# Patient Record
Sex: Male | Born: 1935 | Race: White | Hispanic: No | Marital: Single | Smoking: Never smoker
Health system: Southern US, Community
[De-identification: ages and names within clinical notes are randomized; demographics above are authoritative.]

## PROBLEM LIST (undated history)

## (undated) DIAGNOSIS — M109 Gout, unspecified: Secondary | ICD-10-CM

## (undated) DIAGNOSIS — R0602 Shortness of breath: Secondary | ICD-10-CM

## (undated) DIAGNOSIS — I2510 Atherosclerotic heart disease of native coronary artery without angina pectoris: Secondary | ICD-10-CM

## (undated) DIAGNOSIS — I10 Essential (primary) hypertension: Secondary | ICD-10-CM

## (undated) DIAGNOSIS — E785 Hyperlipidemia, unspecified: Secondary | ICD-10-CM

## (undated) DIAGNOSIS — Z951 Presence of aortocoronary bypass graft: Secondary | ICD-10-CM

## (undated) DIAGNOSIS — G4730 Sleep apnea, unspecified: Secondary | ICD-10-CM

## (undated) DIAGNOSIS — E119 Type 2 diabetes mellitus without complications: Secondary | ICD-10-CM

## (undated) HISTORY — DX: Sleep apnea, unspecified: G47.30

## (undated) HISTORY — DX: Type 2 diabetes mellitus without complications: E11.9

## (undated) HISTORY — DX: Essential (primary) hypertension: I10

## (undated) HISTORY — PX: BLADDER SURGERY: SHX569

## (undated) HISTORY — DX: Hyperlipidemia, unspecified: E78.5

## (undated) HISTORY — DX: Atherosclerotic heart disease of native coronary artery without angina pectoris: I25.10

## (undated) HISTORY — PX: COLONOSCOPY: SHX174

## (undated) HISTORY — PX: APPENDECTOMY: SHX54

## (undated) HISTORY — DX: Gout, unspecified: M10.9

---

## 2006-06-27 ENCOUNTER — Emergency Department: Payer: Self-pay | Admitting: Emergency Medicine

## 2006-09-10 ENCOUNTER — Ambulatory Visit: Payer: Self-pay | Admitting: Family Medicine

## 2007-07-05 ENCOUNTER — Ambulatory Visit: Payer: Self-pay | Admitting: Gastroenterology

## 2010-04-23 ENCOUNTER — Ambulatory Visit: Payer: Self-pay | Admitting: General Surgery

## 2011-06-19 ENCOUNTER — Other Ambulatory Visit (HOSPITAL_COMMUNITY): Payer: Self-pay | Admitting: Urology

## 2011-06-19 DIAGNOSIS — R3129 Other microscopic hematuria: Secondary | ICD-10-CM

## 2011-06-19 DIAGNOSIS — R109 Unspecified abdominal pain: Secondary | ICD-10-CM

## 2011-06-24 ENCOUNTER — Ambulatory Visit (HOSPITAL_COMMUNITY)
Admission: RE | Admit: 2011-06-24 | Discharge: 2011-06-24 | Disposition: A | Discharge status: Home / Self Care | Payer: Medicare Other | Source: Ambulatory Visit | Attending: Urology | Admitting: Urology

## 2011-06-24 DIAGNOSIS — R1032 Left lower quadrant pain: Secondary | ICD-10-CM | POA: Insufficient documentation

## 2011-06-24 DIAGNOSIS — R3129 Other microscopic hematuria: Secondary | ICD-10-CM

## 2011-06-24 DIAGNOSIS — K5730 Diverticulosis of large intestine without perforation or abscess without bleeding: Secondary | ICD-10-CM | POA: Insufficient documentation

## 2011-06-24 DIAGNOSIS — R10A2 Flank pain, left side: Secondary | ICD-10-CM

## 2011-06-24 DIAGNOSIS — R109 Unspecified abdominal pain: Secondary | ICD-10-CM

## 2011-06-24 DIAGNOSIS — N400 Enlarged prostate without lower urinary tract symptoms: Secondary | ICD-10-CM | POA: Insufficient documentation

## 2011-08-18 HISTORY — PX: CARDIAC CATHETERIZATION: SHX172

## 2012-02-09 ENCOUNTER — Ambulatory Visit: Payer: Self-pay | Admitting: Internal Medicine

## 2012-02-25 DIAGNOSIS — E785 Hyperlipidemia, unspecified: Secondary | ICD-10-CM

## 2012-02-25 DIAGNOSIS — E119 Type 2 diabetes mellitus without complications: Secondary | ICD-10-CM

## 2012-02-25 DIAGNOSIS — I10 Essential (primary) hypertension: Secondary | ICD-10-CM

## 2012-02-25 DIAGNOSIS — I2510 Atherosclerotic heart disease of native coronary artery without angina pectoris: Secondary | ICD-10-CM | POA: Insufficient documentation

## 2012-02-25 DIAGNOSIS — G4730 Sleep apnea, unspecified: Secondary | ICD-10-CM

## 2012-02-25 DIAGNOSIS — M109 Gout, unspecified: Secondary | ICD-10-CM

## 2012-02-26 ENCOUNTER — Institutional Professional Consult (permissible substitution) (INDEPENDENT_AMBULATORY_CARE_PROVIDER_SITE_OTHER): Payer: Medicare Other | Admitting: Thoracic Surgery (Cardiothoracic Vascular Surgery)

## 2012-02-26 ENCOUNTER — Encounter: Payer: Self-pay | Admitting: Thoracic Surgery (Cardiothoracic Vascular Surgery)

## 2012-02-26 VITALS — BP 147/80 | HR 97 | Resp 18 | Ht 72.0 in | Wt 216.1 lb

## 2012-02-26 DIAGNOSIS — I2510 Atherosclerotic heart disease of native coronary artery without angina pectoris: Secondary | ICD-10-CM

## 2012-02-26 NOTE — Patient Instructions (Signed)
Use sublingual nitroglycerin as needed for substernal chest pain and called EMS her go directly to the emergency room for chest pain unrelieved by administration of 3 sublingual nitroglycerin tablets. Avoid strenuous physical activity.

## 2012-02-26 NOTE — Progress Notes (Signed)
[address]            [name] [address]          [phone]     CARDIOTHORACIC SURGERY CONSULTATION REPORT  Referring Provider is Lamar Blinks, MD PCP is Bobbye Riggs, NP  Chief Complaint  Patient presents with  . Coronary Artery Disease    eval and treat...cardiac cath at [facility] 02/09/12    HPI:  Patient is a 78 year old gentleman from [place] with no previous history of coronary artery disease but risk factors notable for history of hypertension, type 2 diabetes mellitus, and hyperlipidemia. The patient describes a 2 year history of progressive symptoms of exertional angina. The patient gets dull substernal chest pain with physical activity that is always relieved by rest. Symptoms are associated with nausea and shortness of breath. The patient denies any prolonged episodes of chest pain lasting more than several minutes. He has otherwise remained reasonably active physically. He recently underwent a nuclear stress test that was notable for the absence of EKG changes but perfusion images were highly suspicious for stress-induced ischemia. The patient subsequent underwent cardiac catheterization demonstrating severe three-vessel coronary artery disease with preserved left ventricular function. The patient has now been referred to consider elective surgical revascularization.  Past Medical History  Diagnosis Date  . HTN (hypertension)   . Hyperlipidemia   . Sleep apnea   . Diabetes mellitus   . CAD (coronary artery disease)   . Gout     Past Surgical History  Procedure Date  . Appendectomy     Family History  Problem Relation Age of Onset  . Cancer Father   . Stroke Mother     Social History History  Substance Use Topics  . Smoking status: Never Smoker   . Smokeless tobacco: Never Used  . Alcohol Use: No    Current Outpatient Prescriptions  Medication Sig Dispense Refill  . amLODipine (NORVASC) 5 MG tablet Take 5 mg by  mouth daily.      Marland Kitchen aspirin 81 MG tablet Take 81 mg by mouth daily.      Marland Kitchen glipiZIDE (GLUCOTROL XL) 10 MG 24 hr tablet Take 10 mg by mouth daily.      Marland Kitchen losartan (COZAAR) 100 MG tablet Take 100 mg by mouth daily.      . metFORMIN (GLUCOPHAGE) 500 MG tablet Take 500 mg by mouth 2 (two) times daily with a meal.      . zinc gluconate 50 MG tablet Take 50 mg by mouth every other day.        No Known Allergies   Review of Systems:   General:  good appetite, okay energy, lost some weight   HEENT:  no blurry vision, no recent vision changes, no epistaxis, yes hearing loss, no loose teeth or painful teeth, no dentures, last saw dentist recently  Respiratory:  yes shortness of breath, no cough, no wheezing, no hemoptysis, no pain with inspiration or cough  Cardiac:  yes chest pain with exertion dull pain in the center of the chest goes away pretty quickly, SOB with mild exertion, no resting SOB, no PND, no orthopnea, once LE edema, no palpitations, no arrhythmia, no dizzy spells, no syncope  Vascular:  no pain suggestive of claudication  GI:   no difficulty swallowing, no heartburn/reflux, no hematochezia, no hematemesis, no melena, no constipation, no diarrhea   GU:  no dysuria, no urgency, no frequency  Musculoskeletal: no arthritis, no myalgias, mobility no, some difficulty walking with pain in the chest  Neuro:   no symptoms suggestive of TIA's, no seizures, no headaches, no peripheral neuropathy, no instability of gait, no memory/cognitive dysfunction  Endocrine:  yes diabetes - reports CBGs under good control, no thyroid dysfunction  Hematologic:  no easy bruising, no anemia, no abnormal bleeding  Psych:   no anxiety, no depression     Physical Exam:   BP 147/80  Pulse 97  Resp 18  Ht 6' (1.829 m)  Wt 216 lb 0.8 oz (98 kg)  BMI 29.30 kg/m2  SpO2 95%  General:  Moderately obese,  well-appearing  HEENT:  Unremarkable   Neck:   no JVD, no bruits, no adenopathy   Chest:   clear to  auscultation, symmetrical breath sounds, no wheezes, no rhonchi   CV:   RRR,  no murmur  Abdomen:  soft, non-tender, no masses   Extremities:  warm, well-perfused, pulses not palpable, no LE edema  Rectal/GU  Deferred  Neuro:   Grossly non-focal and symmetrical throughout  Skin:   Clean and dry, no rashes, no breakdown   Diagnostic Tests:  CARDIAC CATHETERIZATION  Cardiac catheterization performed 02/09/2012 at [facility] demonstrates severe three-vessel coronary artery disease with preserved left ventricular function. Specifically, there is diffuse heavy calcification all of the epicardial coronary arteries proximally with 60% proximal left anterior descending coronary artery stenosis, 90% stenosis of the mid left anterior descending coronary artery just prior to the diagonal branch, and 90% stenosis of the distal left anterior descending coronary artery which is a large vessel that wraps the apex. The left circumflex system is relatively small. There is 60% ostial stenosis of left circumflex. There is diffuse disease with hazy 80-90% proximal right coronary artery stenosis her left ventricular systolic function is preserved with ejection fraction greater than 55%.   Impression:  Severe three-vessel coronary artery disease with preserved left ventricular function and stable symptoms of exertional angina pectoris. I agree that the patient would best be treated with surgical revascularization.   Plan:  Have outlined at length the indications, risks, and potential benefits of coronary artery bypass grafting with the patient and his wife here in the office today. Alternative treatment strategies been discussed. They understand and accept all potential associated risks of surgery including but not limited to risk of death, stroke, myocardial infarction, congestive heart failure, respiratory failure, pneumonia, bleeding requiring blood transfusion, arrhythmia, infection, and  late recurrence of symptomatic coronary artery disease. The patient desires to wait until later this months for personal reasons before proceeding with surgery. We tentatively plan for surgery on Friday, July 26. The patient will return to the office year on Monday, July 22 for followup prior to surgery. All of his questions been addressed. The patient has specifically been instructed to use sublingual nitroglycerin as needed for substernal chest pain lasting more than one or 2 minutes and to go to the emergency room or some and EMS should he develop chest pain unrelieved by administration of nitroglycerin. He is been advised to avoid strenuous physical activity.    Salvatore Decent. Cornelius Moras, MD 02/26/2012 5:16 PM

## 2012-02-29 ENCOUNTER — Encounter (HOSPITAL_COMMUNITY): Payer: Self-pay | Admitting: Pharmacy Technician

## 2012-02-29 ENCOUNTER — Other Ambulatory Visit: Payer: Self-pay | Admitting: *Deleted

## 2012-02-29 DIAGNOSIS — I2510 Atherosclerotic heart disease of native coronary artery without angina pectoris: Secondary | ICD-10-CM

## 2012-03-07 ENCOUNTER — Encounter: Payer: Self-pay | Admitting: Thoracic Surgery (Cardiothoracic Vascular Surgery)

## 2012-03-07 ENCOUNTER — Ambulatory Visit (HOSPITAL_COMMUNITY)
Admission: RE | Admit: 2012-03-07 | Discharge: 2012-03-07 | Disposition: A | Discharge status: Home / Self Care | Payer: Medicare Other | Source: Ambulatory Visit | Attending: Thoracic Surgery (Cardiothoracic Vascular Surgery) | Admitting: Thoracic Surgery (Cardiothoracic Vascular Surgery)

## 2012-03-07 ENCOUNTER — Encounter (HOSPITAL_COMMUNITY): Payer: Self-pay

## 2012-03-07 ENCOUNTER — Encounter (HOSPITAL_COMMUNITY)
Admission: RE | Admit: 2012-03-07 | Discharge: 2012-03-07 | Disposition: A | Discharge status: Home / Self Care | Payer: Medicare Other | Source: Ambulatory Visit | Attending: Thoracic Surgery (Cardiothoracic Vascular Surgery) | Admitting: Thoracic Surgery (Cardiothoracic Vascular Surgery)

## 2012-03-07 ENCOUNTER — Ambulatory Visit (INDEPENDENT_AMBULATORY_CARE_PROVIDER_SITE_OTHER): Payer: Medicare Other | Admitting: Thoracic Surgery (Cardiothoracic Vascular Surgery)

## 2012-03-07 VITALS — BP 139/78 | HR 79 | Temp 97.8°F | Resp 18 | Ht 72.0 in | Wt 214.9 lb

## 2012-03-07 VITALS — BP 151/74 | HR 78 | Resp 18 | Ht 72.0 in | Wt 215.0 lb

## 2012-03-07 DIAGNOSIS — R0602 Shortness of breath: Secondary | ICD-10-CM | POA: Insufficient documentation

## 2012-03-07 DIAGNOSIS — I2510 Atherosclerotic heart disease of native coronary artery without angina pectoris: Secondary | ICD-10-CM

## 2012-03-07 DIAGNOSIS — Z01812 Encounter for preprocedural laboratory examination: Secondary | ICD-10-CM | POA: Insufficient documentation

## 2012-03-07 DIAGNOSIS — I10 Essential (primary) hypertension: Secondary | ICD-10-CM

## 2012-03-07 DIAGNOSIS — E785 Hyperlipidemia, unspecified: Secondary | ICD-10-CM

## 2012-03-07 DIAGNOSIS — Z01818 Encounter for other preprocedural examination: Secondary | ICD-10-CM | POA: Insufficient documentation

## 2012-03-07 DIAGNOSIS — E119 Type 2 diabetes mellitus without complications: Secondary | ICD-10-CM

## 2012-03-07 DIAGNOSIS — Z01810 Encounter for preprocedural cardiovascular examination: Secondary | ICD-10-CM

## 2012-03-07 HISTORY — DX: Shortness of breath: R06.02

## 2012-03-07 LAB — COMPREHENSIVE METABOLIC PANEL
ALT: 20 U/L (ref 0–53)
AST: 20 U/L (ref 0–37)
Albumin: 3.8 g/dL (ref 3.5–5.2)
Alkaline Phosphatase: 52 U/L (ref 39–117)
Chloride: 102 mEq/L (ref 96–112)
Creatinine, Ser: 1.56 mg/dL — ABNORMAL HIGH (ref 0.50–1.35)
Potassium: 4.1 mEq/L (ref 3.5–5.1)
Sodium: 139 mEq/L (ref 135–145)
Total Bilirubin: 0.6 mg/dL (ref 0.3–1.2)

## 2012-03-07 LAB — CBC
HCT: 38.7 % — ABNORMAL LOW (ref 39.0–52.0)
MCH: 31.2 pg (ref 26.0–34.0)
MCHC: 34.1 g/dL (ref 30.0–36.0)
MCV: 91.5 fL (ref 78.0–100.0)
Platelets: 193 10*3/uL (ref 150–400)
RDW: 12.8 % (ref 11.5–15.5)
WBC: 10.2 10*3/uL (ref 4.0–10.5)

## 2012-03-07 LAB — PULMONARY FUNCTION TEST

## 2012-03-07 LAB — URINALYSIS, ROUTINE W REFLEX MICROSCOPIC
Bilirubin Urine: NEGATIVE
Glucose, UA: NEGATIVE mg/dL
Hgb urine dipstick: NEGATIVE
Ketones, ur: NEGATIVE mg/dL
Leukocytes, UA: NEGATIVE
Nitrite: NEGATIVE
Protein, ur: NEGATIVE mg/dL
Specific Gravity, Urine: 1.018 (ref 1.005–1.030)
Urobilinogen, UA: 0.2 mg/dL (ref 0.0–1.0)
pH: 5 (ref 5.0–8.0)

## 2012-03-07 LAB — BLOOD GAS, ARTERIAL
Acid-base deficit: 1.7 mmol/L (ref 0.0–2.0)
Drawn by: 206361
O2 Saturation: 95.6 %
Patient temperature: 98.6
TCO2: 23.5 mmol/L (ref 0–100)
pCO2 arterial: 36.4 mmHg (ref 35.0–45.0)

## 2012-03-07 LAB — APTT: aPTT: 26 seconds (ref 24–37)

## 2012-03-07 LAB — SURGICAL PCR SCREEN: MRSA, PCR: NEGATIVE

## 2012-03-07 MED ORDER — ALBUTEROL SULFATE (5 MG/ML) 0.5% IN NEBU
2.5000 mg | INHALATION_SOLUTION | Freq: Once | RESPIRATORY_TRACT | Status: AC
  Administered 2012-03-07: 2.5 mg via RESPIRATORY_TRACT

## 2012-03-07 NOTE — Progress Notes (Signed)
                   [address]            [name] [address]          [phone]     CARDIOTHORACIC SURGERY OFFICE NOTE  Referring Provider is Arnoldo Hooker, MD PCP is Bobbye Riggs, NP   HPI:  Patient returns for follow up with tentative plans for surgery on 03/11/2012.  He was originally seen in consultation on 02/26/2012.  He denies having any significant chest pain, and he has not complained of any other new problems.   Current Outpatient Prescriptions  Medication Sig Dispense Refill  . amLODipine (NORVASC) 5 MG tablet Take 5 mg by mouth daily.      Marland Kitchen aspirin 81 MG tablet Take 81 mg by mouth daily.      Marland Kitchen glipiZIDE (GLUCOTROL XL) 10 MG 24 hr tablet Take 10 mg by mouth daily.      Marland Kitchen losartan (COZAAR) 100 MG tablet Take 100 mg by mouth daily.      . metFORMIN (GLUCOPHAGE) 500 MG tablet Take 500-1,000 mg by mouth 3 (three) times daily. Take 2 tab in the morning, 1 at lunch, and 1 in the evening      . Multiple Vitamin (MULTIVITAMIN WITH MINERALS) TABS Take 1 tablet by mouth every other day.      . zinc gluconate 50 MG tablet Take 50 mg by mouth every other day.          Physical Exam:   BP 151/74  Pulse 78  Resp 18  Ht 6' (1.829 m)  Wt 215 lb (97.523 kg)  BMI 29.16 kg/m2  SpO2 96%  General:  Well appearing  Chest:   clear  CV:   RRR  Incisions:  n/a  Abdomen:  soft  Extremities:  warm  Diagnostic Tests:  n/a   Impression:  Severe 3-vessel CAD with stable exertional angina.   Plan:  We plan coronary artery bypass grafting on Friday, July 26. I have again reviewed the indications, risks, and potential benefits of surgery with the patient and his wife here in the office today. Also questions been addressed. He has been instructed not to take any medications on the morning of surgery other than his aspirin. He will be given a beta blocker on arrival to the hospital.    Salvatore Decent. Cornelius Moras, MD 03/07/2012 9:19 AM

## 2012-03-07 NOTE — H&P (Signed)
CARDIOTHORACIC SURGERY HISTORY AND PHYSICAL EXAM  Referring Provider is Lamar Blinks, MD PCP is Bobbye Riggs, NP    Chief Complaint   Patient presents with   .  Coronary Artery Disease       eval and treat...cardiac cath at [facility] 02/09/12     HPI:  Patient is a 78 year old gentleman from [place] with no previous history of coronary artery disease but risk factors notable for history of hypertension, type 2 diabetes mellitus, and hyperlipidemia. The patient describes a 2 year history of progressive symptoms of exertional angina. The patient gets dull substernal chest pain with physical activity that is always relieved by rest. Symptoms are associated with nausea and shortness of breath. The patient denies any prolonged episodes of chest pain lasting more than several minutes. He has otherwise remained reasonably active physically. He recently underwent a nuclear stress test that was notable for the absence of EKG changes but perfusion images were highly suspicious for stress-induced ischemia. The patient subsequent underwent cardiac catheterization demonstrating severe three-vessel coronary artery disease with preserved left ventricular function. The patient has now been referred to consider elective surgical revascularization.    Past Medical History   Diagnosis  Date   .  HTN (hypertension)     .  Hyperlipidemia     .  Sleep apnea     .  Diabetes mellitus     .  CAD (coronary artery disease)     .  Gout         Past Surgical History   Procedure  Date   .  Appendectomy         Family History   Problem  Relation  Age of Onset   .  Cancer  Father     .  Stroke  Mother       Social History History   Substance Use Topics   .  Smoking status:  Never Smoker    .  Smokeless tobacco:  Never Used   .  Alcohol Use:  No       Current Outpatient Prescriptions   Medication  Sig  Dispense  Refill   .  amLODipine (NORVASC) 5 MG tablet  Take 5 mg by mouth daily.          Marland Kitchen  aspirin 81 MG tablet  Take 81 mg by mouth daily.         Marland Kitchen  glipiZIDE (GLUCOTROL XL) 10 MG 24 hr tablet  Take 10 mg by mouth daily.         Marland Kitchen  losartan (COZAAR) 100 MG tablet  Take 100 mg by mouth daily.         .  metFORMIN (GLUCOPHAGE) 500 MG tablet  Take 500 mg by mouth 2 (two) times daily with a meal.         .  zinc gluconate 50 MG tablet  Take 50 mg by mouth every other day.           No Known Allergies   Review of Systems:              General:                      good appetite, okay energy, lost some weight               HEENT:  no blurry vision, no recent vision changes, no epistaxis, yes hearing loss, no loose teeth or painful teeth, no dentures, last saw dentist recently             Respiratory:                yes shortness of breath, no cough, no wheezing, no hemoptysis, no pain with inspiration or cough             Cardiac:                      yes chest pain with exertion dull pain in the center of the chest goes away pretty quickly, SOB with mild exertion, no resting SOB, no PND, no orthopnea, once LE edema, no palpitations, no arrhythmia, no dizzy spells, no syncope             Vascular:                     no pain suggestive of claudication             GI:                                no difficulty swallowing, no heartburn/reflux, no hematochezia, no hematemesis, no melena, no constipation, no diarrhea               GU:                              no dysuria, no urgency, no frequency             Musculoskeletal:         no arthritis, no myalgias, mobility no, some difficulty walking with pain in the chest             Neuro:                         no symptoms suggestive of TIA's, no seizures, no headaches, no peripheral neuropathy, no instability of gait, no memory/cognitive dysfunction             Endocrine:                   yes diabetes - reports CBGs under good control, no thyroid dysfunction             Hematologic:               no easy  bruising, no anemia, no abnormal bleeding             Psych:                         no anxiety, no depression                           Physical Exam:              BP 147/80  Pulse 97  Resp 18  Ht 6' (1.829 m)  Wt 216 lb 0.8 oz (98 kg)  BMI 29.30 kg/m2  SpO2 95%             General:  Moderately obese,  well-appearing             HEENT:                       Unremarkable               Neck:                           no JVD, no bruits, no adenopathy               Chest:                         clear to auscultation, symmetrical breath sounds, no wheezes, no rhonchi               CV:                              RRR,  no murmur             Abdomen:                    soft, non-tender, no masses               Extremities:                 warm, well-perfused, pulses not palpable, no LE edema             Rectal/GU                   Deferred             Neuro:                         Grossly non-focal and symmetrical throughout             Skin:                            Clean and dry, no rashes, no breakdown   Diagnostic Tests:  CARDIAC CATHETERIZATION  Cardiac catheterization performed 02/09/2012 at [facility] demonstrates severe three-vessel coronary artery disease with preserved left ventricular function. Specifically, there is diffuse heavy calcification all of the epicardial coronary arteries proximally with 60% proximal left anterior descending coronary artery stenosis, 90% stenosis of the mid left anterior descending coronary artery just prior to the diagonal branch, and 90% stenosis of the distal left anterior descending coronary artery which is a large vessel that wraps the apex. The left circumflex system is relatively small. There is 60% ostial stenosis of left circumflex. There is diffuse disease with hazy 80-90% proximal right coronary artery stenosis her left ventricular systolic function is preserved with ejection fraction greater than  55%.   Impression:  Severe three-vessel coronary artery disease with preserved left ventricular function and stable symptoms of exertional angina pectoris. I agree that the patient would best be treated with surgical revascularization.   Plan:  I have outlined at length the indications, risks, and potential benefits of coronary artery bypass grafting with the patient and his wife. Alternative treatment strategies been discussed. They understand and accept all potential associated risks of surgery including but not limited to risk of death, stroke, myocardial infarction, congestive heart failure, respiratory failure, pneumonia, bleeding requiring  blood transfusion, arrhythmia, infection, and late recurrence of symptomatic coronary artery disease. All of his questions been addressed.    Morghan Kester H

## 2012-03-07 NOTE — Progress Notes (Signed)
VASCULAR LAB PRELIMINARY  PRELIMINARY  PRELIMINARY  PRELIMINARY  Pre-op Cardiac Surgery  Carotid Findings:  No evidence of significant carotid artery stenosis. Vertebral artery flow is antegrade bilaterally  Upper Extremity Right Left  Brachial Pressures 138 Triphasic 142 Triphasic  Radial Waveforms Triphasic Triphasic  Ulnar Waveforms Triphasic Triphasic  Palmar Arch (Allen's Test) Normal Abnormal   Findings:  Doppler waveforms remained normal with both radial and ulnar compressions on the right. Left Doppler waveforms remained normal with radial compression and diminished 50% with ulnar compression.    Lower  Extremity Right Left  Dorsalis Pedis 123 Monophasic 148 Biphasic      Posterior Tibial 141 Biphasic 150 Biphasic  Ankle/Brachial Indices 1.01 1.07    Findings:  ABIs are within normal limits bilaterally at rest   Areyanna Figeroa, RVS 03/07/2012, 1:01 PM

## 2012-03-07 NOTE — Patient Instructions (Signed)
Do not take any of your usual medications on the morning of surgery other than aspirin.

## 2012-03-07 NOTE — Pre-Procedure Instructions (Signed)
20 [name] [name]  03/07/2012   Your procedure is scheduled on:  Friday March 11, 2012.  Report to [facility] at 0530 AM.  Call this number if you have problems the morning of surgery: [phone]   Remember:   Do not eat food:After Midnight.    Take these medicines the morning of surgery with A SIP OF WATER: Amlodipine (Norvasc)   Please stop any Aspirin, Plavix, or herbal medications   Do not wear jewelry, make-up or nail polish  Do not wear lotions or colognes.  Men may shave face and neck.  Do not bring valuables to the hospital.  Contacts, dentures or bridgework may not be worn into surgery.  Leave suitcase in the car. After surgery it may be brought to your room.  For patients admitted to the hospital, checkout time is 11:00 AM the day of discharge.   Patients discharged the day of surgery will not be allowed to drive home.  Name and phone number of your driver:   Special Instructions: CHG Shower Use Special Wash: 1/2 bottle night before surgery and 1/2 bottle morning of surgery.   Please read over the following fact sheets that you were given: Pain Booklet, Coughing and Deep Breathing, Open Heart Packet, MRSA Information and Surgical Site Infection Prevention

## 2012-03-08 LAB — HEMOGLOBIN A1C: Mean Plasma Glucose: 171 mg/dL — ABNORMAL HIGH (ref <117)

## 2012-03-08 NOTE — Consult Note (Addendum)
Anesthesia Chart Review:  Patient is a 78 year old male scheduled for CABG on 03/11/12 by Dr. Cornelius Moras.  History includes CAD, HTN, HLD, DM2, OSA, gout, SOB.  His Cardiologist is Dr. Arnoldo Hooker.  PCP Bobbye Riggs, NP.  EKG on 03/07/12 showed NSR with PACs, PVCs.  He had an abnormal stress test at [facility] on 01/18/12 and subsequently had a cardiac catheterization at [facility] on 02/09/12 that demonstrated severe three-vessel coronary artery disease with preserved left ventricular function. Specifically, there is diffuse heavy calcification all of the epicardial coronary arteries proximally with 60% proximal left anterior descending coronary artery stenosis, 90% stenosis of the mid left anterior descending coronary artery just prior to the diagonal branch, and 90% stenosis of the distal left anterior descending coronary artery which is a large vessel that wraps the apex. The left circumflex system is relatively small. There is 60% ostial stenosis of left circumflex. There is diffuse disease with hazy 80-90% proximal right coronary artery stenosis her left ventricular systolic function is preserved with ejection fraction greater than 55%.   CXR on 03/07/12 showed no active cardiopulmonary disease.  PFTs from 03/07/12 showed FEV1 2.13 (75% predicted).  See full report.  There was no significant ICA stenosis by duplex on 03/07/12.  Labs noted.  Cr 1.56.  Glucose 166.  There are no comparison labs currently available.  Epic records indicate labs have already been reviewed by Dr. Cornelius Moras.  Shonna Chock, PA-C

## 2012-03-10 MED ORDER — VANCOMYCIN HCL 1000 MG IV SOLR
1500.0000 mg | INTRAVENOUS | Status: AC
  Administered 2012-03-11: 1500 mg via INTRAVENOUS
  Filled 2012-03-10 (×3): qty 1500

## 2012-03-10 MED ORDER — TRANEXAMIC ACID 100 MG/ML IV SOLN
1.5000 mg/kg/h | INTRAVENOUS | Status: DC
  Filled 2012-03-10 (×2): qty 25

## 2012-03-10 MED ORDER — DOPAMINE-DEXTROSE 3.2-5 MG/ML-% IV SOLN
2.0000 ug/kg/min | INTRAVENOUS | Status: DC
  Filled 2012-03-10 (×2): qty 250

## 2012-03-10 MED ORDER — SODIUM BICARBONATE 8.4 % IV SOLN
INTRAVENOUS | Status: AC
  Administered 2012-03-11: 10:00:00
  Filled 2012-03-10: qty 2.5

## 2012-03-10 MED ORDER — DEXMEDETOMIDINE HCL IN NACL 400 MCG/100ML IV SOLN
0.1000 ug/kg/h | INTRAVENOUS | Status: DC
  Filled 2012-03-10 (×2): qty 100

## 2012-03-10 MED ORDER — NITROGLYCERIN IN D5W 200-5 MCG/ML-% IV SOLN
2.0000 ug/min | INTRAVENOUS | Status: DC
  Filled 2012-03-10: qty 250

## 2012-03-10 MED ORDER — MAGNESIUM SULFATE 50 % IJ SOLN
40.0000 meq | INTRAMUSCULAR | Status: DC
  Filled 2012-03-10: qty 10

## 2012-03-10 MED ORDER — EPINEPHRINE HCL 1 MG/ML IJ SOLN
0.5000 ug/min | INTRAVENOUS | Status: DC
  Filled 2012-03-10: qty 4

## 2012-03-10 MED ORDER — METOPROLOL TARTRATE 12.5 MG HALF TABLET
12.5000 mg | ORAL_TABLET | Freq: Once | ORAL | Status: AC
  Administered 2012-03-11: 12.5 mg via ORAL
  Filled 2012-03-10: qty 1

## 2012-03-10 MED ORDER — DEXTROSE 5 % IV SOLN
30.0000 ug/min | INTRAVENOUS | Status: DC
  Filled 2012-03-10: qty 2

## 2012-03-10 MED ORDER — TRANEXAMIC ACID (OHS) BOLUS VIA INFUSION
15.0000 mg/kg | INTRAVENOUS | Status: DC
  Filled 2012-03-10: qty 1463

## 2012-03-10 MED ORDER — SODIUM CHLORIDE 0.9 % IV SOLN
INTRAVENOUS | Status: DC
  Filled 2012-03-10: qty 1

## 2012-03-10 MED ORDER — TRANEXAMIC ACID (OHS) PUMP PRIME SOLUTION
2.0000 mg/kg | INTRAVENOUS | Status: DC
  Filled 2012-03-10: qty 1.95

## 2012-03-10 MED ORDER — POTASSIUM CHLORIDE 2 MEQ/ML IV SOLN
80.0000 meq | INTRAVENOUS | Status: DC
  Filled 2012-03-10: qty 40

## 2012-03-10 MED ORDER — DEXTROSE 5 % IV SOLN
1.5000 g | INTRAVENOUS | Status: AC
  Administered 2012-03-11: 1.5 g via INTRAVENOUS
  Administered 2012-03-11: .75 g via INTRAVENOUS
  Filled 2012-03-10 (×2): qty 1.5

## 2012-03-10 MED ORDER — CEFUROXIME SODIUM 750 MG IJ SOLR
750.0000 mg | INTRAMUSCULAR | Status: DC
  Filled 2012-03-10: qty 750

## 2012-03-10 NOTE — Progress Notes (Signed)
Call to [facility]- no sleep study available.  Call to pt., spoke /w spouse, she reports sleep study was done 3-4 yrs. Ago, at sleep center on [address] States pt. doesn't use CPAP, he gave the machine away. Call to S. Mand, NP- phone no. not ringing, line is dead when no. Is dialed multiple times. Call to [facility], spoke with [facility], she will get in touch /w pt. to sign a release.  She reports a release must be signed before she can fax me report. She is aware that surgery is 03/11/2012.

## 2012-03-10 NOTE — Progress Notes (Signed)
Call back fr. Molly at [facility], states pt. Is scheduled to appear in the [facility]  office today to sign a release & when he does, they will both fax the sleep study to [place] & give him a copy to carry to [facility] for tomorrow's surgery.

## 2012-03-11 ENCOUNTER — Encounter (HOSPITAL_COMMUNITY): Payer: Self-pay | Admitting: Thoracic Surgery (Cardiothoracic Vascular Surgery)

## 2012-03-11 ENCOUNTER — Inpatient Hospital Stay (HOSPITAL_COMMUNITY): Payer: Medicare Other

## 2012-03-11 ENCOUNTER — Inpatient Hospital Stay (HOSPITAL_COMMUNITY)
Admission: RE | Admit: 2012-03-11 | Discharge: 2012-03-16 | DRG: 236 | Disposition: A | Discharge status: Home / Self Care | Payer: Medicare Other | Source: Ambulatory Visit | Attending: Thoracic Surgery (Cardiothoracic Vascular Surgery) | Admitting: Thoracic Surgery (Cardiothoracic Vascular Surgery)

## 2012-03-11 ENCOUNTER — Encounter (HOSPITAL_COMMUNITY): Payer: Self-pay | Admitting: Vascular Surgery

## 2012-03-11 ENCOUNTER — Encounter (HOSPITAL_COMMUNITY)
Admission: RE | Disposition: A | Discharge status: Home / Self Care | Payer: Self-pay | Source: Ambulatory Visit | Attending: Thoracic Surgery (Cardiothoracic Vascular Surgery)

## 2012-03-11 ENCOUNTER — Inpatient Hospital Stay (HOSPITAL_COMMUNITY): Payer: Medicare Other | Admitting: Vascular Surgery

## 2012-03-11 ENCOUNTER — Encounter (HOSPITAL_COMMUNITY): Payer: Self-pay | Admitting: *Deleted

## 2012-03-11 DIAGNOSIS — E876 Hypokalemia: Secondary | ICD-10-CM | POA: Diagnosis not present

## 2012-03-11 DIAGNOSIS — J9819 Other pulmonary collapse: Secondary | ICD-10-CM | POA: Diagnosis not present

## 2012-03-11 DIAGNOSIS — J988 Other specified respiratory disorders: Secondary | ICD-10-CM | POA: Diagnosis not present

## 2012-03-11 DIAGNOSIS — N189 Chronic kidney disease, unspecified: Secondary | ICD-10-CM | POA: Diagnosis present

## 2012-03-11 DIAGNOSIS — I2510 Atherosclerotic heart disease of native coronary artery without angina pectoris: Principal | ICD-10-CM

## 2012-03-11 DIAGNOSIS — Y832 Surgical operation with anastomosis, bypass or graft as the cause of abnormal reaction of the patient, or of later complication, without mention of misadventure at the time of the procedure: Secondary | ICD-10-CM | POA: Diagnosis not present

## 2012-03-11 DIAGNOSIS — N289 Disorder of kidney and ureter, unspecified: Secondary | ICD-10-CM | POA: Diagnosis not present

## 2012-03-11 DIAGNOSIS — I519 Heart disease, unspecified: Secondary | ICD-10-CM | POA: Diagnosis not present

## 2012-03-11 DIAGNOSIS — I4891 Unspecified atrial fibrillation: Secondary | ICD-10-CM | POA: Diagnosis not present

## 2012-03-11 DIAGNOSIS — E785 Hyperlipidemia, unspecified: Secondary | ICD-10-CM | POA: Diagnosis present

## 2012-03-11 DIAGNOSIS — E119 Type 2 diabetes mellitus without complications: Secondary | ICD-10-CM | POA: Diagnosis present

## 2012-03-11 DIAGNOSIS — D62 Acute posthemorrhagic anemia: Secondary | ICD-10-CM | POA: Diagnosis not present

## 2012-03-11 DIAGNOSIS — Z951 Presence of aortocoronary bypass graft: Secondary | ICD-10-CM

## 2012-03-11 DIAGNOSIS — I129 Hypertensive chronic kidney disease with stage 1 through stage 4 chronic kidney disease, or unspecified chronic kidney disease: Secondary | ICD-10-CM | POA: Diagnosis present

## 2012-03-11 DIAGNOSIS — M109 Gout, unspecified: Secondary | ICD-10-CM | POA: Diagnosis present

## 2012-03-11 DIAGNOSIS — G4733 Obstructive sleep apnea (adult) (pediatric): Secondary | ICD-10-CM | POA: Diagnosis present

## 2012-03-11 DIAGNOSIS — Z79899 Other long term (current) drug therapy: Secondary | ICD-10-CM

## 2012-03-11 DIAGNOSIS — Y9210 Unspecified residential institution as the place of occurrence of the external cause: Secondary | ICD-10-CM | POA: Diagnosis not present

## 2012-03-11 DIAGNOSIS — Z7982 Long term (current) use of aspirin: Secondary | ICD-10-CM

## 2012-03-11 DIAGNOSIS — E8779 Other fluid overload: Secondary | ICD-10-CM | POA: Diagnosis not present

## 2012-03-11 HISTORY — PX: CORONARY ARTERY BYPASS GRAFT: SHX141

## 2012-03-11 HISTORY — DX: Presence of aortocoronary bypass graft: Z95.1

## 2012-03-11 LAB — CBC
HCT: 24.8 % — ABNORMAL LOW (ref 39.0–52.0)
MCH: 30.8 pg (ref 26.0–34.0)
MCH: 30.7 pg (ref 26.0–34.0)
MCV: 90.8 fL (ref 78.0–100.0)
Platelets: 135 10*3/uL — ABNORMAL LOW (ref 150–400)
Platelets: 159 10*3/uL (ref 150–400)
RBC: 3.03 MIL/uL — ABNORMAL LOW (ref 4.22–5.81)
RDW: 12.9 % (ref 11.5–15.5)
RDW: 14.1 % (ref 11.5–15.5)
WBC: 15.5 10*3/uL — ABNORMAL HIGH (ref 4.0–10.5)
WBC: 15 10*3/uL — ABNORMAL HIGH (ref 4.0–10.5)

## 2012-03-11 LAB — GLUCOSE, CAPILLARY
Glucose-Capillary: 156 mg/dL — ABNORMAL HIGH (ref 70–99)
Glucose-Capillary: 115 mg/dL — ABNORMAL HIGH (ref 70–99)
Glucose-Capillary: 116 mg/dL — ABNORMAL HIGH (ref 70–99)
Glucose-Capillary: 97 mg/dL (ref 70–99)
Glucose-Capillary: 129 mg/dL — ABNORMAL HIGH (ref 70–99)
Glucose-Capillary: 150 mg/dL — ABNORMAL HIGH (ref 70–99)
Glucose-Capillary: 120 mg/dL — ABNORMAL HIGH (ref 70–99)
Glucose-Capillary: 92 mg/dL (ref 70–99)

## 2012-03-11 LAB — POCT I-STAT 3, ART BLOOD GAS (G3+)
Acid-base deficit: 5 mmol/L — ABNORMAL HIGH (ref 0.0–2.0)
Acid-base deficit: 6 mmol/L — ABNORMAL HIGH (ref 0.0–2.0)
Bicarbonate: 21.6 mEq/L (ref 20.0–24.0)
Bicarbonate: 20.5 mEq/L (ref 20.0–24.0)
O2 Saturation: 100 %
O2 Saturation: 100 %
O2 Saturation: 93 %
O2 Saturation: 95 %
TCO2: 22 mmol/L (ref 0–100)
pCO2 arterial: 38.3 mmHg (ref 35.0–45.0)
pCO2 arterial: 32.1 mmHg — ABNORMAL LOW (ref 35.0–45.0)
pCO2 arterial: 36.8 mmHg (ref 35.0–45.0)
pH, Arterial: 7.374 (ref 7.350–7.450)
pO2, Arterial: 297 mmHg — ABNORMAL HIGH (ref 80.0–100.0)
pO2, Arterial: 230 mmHg — ABNORMAL HIGH (ref 80.0–100.0)
pO2, Arterial: 77 mmHg — ABNORMAL LOW (ref 80.0–100.0)

## 2012-03-11 LAB — POCT I-STAT 4, (NA,K, GLUC, HGB,HCT)
Glucose, Bld: 156 mg/dL — ABNORMAL HIGH (ref 70–99)
Glucose, Bld: 139 mg/dL — ABNORMAL HIGH (ref 70–99)
Glucose, Bld: 193 mg/dL — ABNORMAL HIGH (ref 70–99)
HCT: 23 % — ABNORMAL LOW (ref 39.0–52.0)
Hemoglobin: 9.5 g/dL — ABNORMAL LOW (ref 13.0–17.0)
Hemoglobin: 7.8 g/dL — ABNORMAL LOW (ref 13.0–17.0)
Potassium: 3.3 mEq/L — ABNORMAL LOW (ref 3.5–5.1)
Potassium: 4 mEq/L (ref 3.5–5.1)
Potassium: 4 mEq/L (ref 3.5–5.1)
Sodium: 144 mEq/L (ref 135–145)
Sodium: 141 mEq/L (ref 135–145)

## 2012-03-11 LAB — MAGNESIUM: Magnesium: 2.3 mg/dL (ref 1.5–2.5)

## 2012-03-11 LAB — POCT I-STAT, CHEM 8
BUN: 12 mg/dL (ref 6–23)
Calcium, Ion: 1.16 mmol/L (ref 1.13–1.30)
Chloride: 110 mEq/L (ref 96–112)
Creatinine, Ser: 1.3 mg/dL (ref 0.50–1.35)
Glucose, Bld: 144 mg/dL — ABNORMAL HIGH (ref 70–99)
TCO2: 20 mmol/L (ref 0–100)

## 2012-03-11 LAB — APTT: aPTT: 33 seconds (ref 24–37)

## 2012-03-11 LAB — CREATININE, SERUM: Creatinine, Ser: 1.2 mg/dL (ref 0.50–1.35)

## 2012-03-11 LAB — PREPARE RBC (CROSSMATCH)

## 2012-03-11 LAB — PLATELET COUNT: Platelets: 179 10*3/uL (ref 150–400)

## 2012-03-11 LAB — HEMOGLOBIN AND HEMATOCRIT, BLOOD: HCT: 23.2 % — ABNORMAL LOW (ref 39.0–52.0)

## 2012-03-11 SURGERY — CORONARY ARTERY BYPASS GRAFTING (CABG)
Anesthesia: General | Site: Chest | Wound class: Clean

## 2012-03-11 MED ORDER — CALCIUM CHLORIDE 10 % IV SOLN
1.0000 g | Freq: Once | INTRAVENOUS | Status: AC | PRN
  Filled 2012-03-11: qty 10

## 2012-03-11 MED ORDER — PANTOPRAZOLE SODIUM 40 MG PO TBEC
40.0000 mg | DELAYED_RELEASE_TABLET | Freq: Every day | ORAL | Status: DC
  Administered 2012-03-13 – 2012-03-15 (×3): 40 mg via ORAL
  Filled 2012-03-11 (×3): qty 1

## 2012-03-11 MED ORDER — PROTAMINE SULFATE 10 MG/ML IV SOLN
INTRAVENOUS | Status: DC | PRN
  Administered 2012-03-11 (×2): 50 mg via INTRAVENOUS
  Administered 2012-03-11: 150 mg via INTRAVENOUS
  Administered 2012-03-11: 50 mg via INTRAVENOUS

## 2012-03-11 MED ORDER — SODIUM CHLORIDE 0.45 % IV SOLN
INTRAVENOUS | Status: DC
  Administered 2012-03-11: 13:00:00 via INTRAVENOUS

## 2012-03-11 MED ORDER — HEPARIN SODIUM (PORCINE) 1000 UNIT/ML IJ SOLN
INTRAMUSCULAR | Status: AC
  Filled 2012-03-11: qty 1

## 2012-03-11 MED ORDER — OXYCODONE HCL 5 MG PO TABS
5.0000 mg | ORAL_TABLET | ORAL | Status: DC | PRN
  Administered 2012-03-12: 5 mg via ORAL
  Filled 2012-03-11: qty 1

## 2012-03-11 MED ORDER — SODIUM CHLORIDE 0.9 % IV SOLN: 250.0000 mL | INTRAVENOUS | Status: DC

## 2012-03-11 MED ORDER — ALBUMIN HUMAN 5 % IV SOLN
INTRAVENOUS | Status: AC
  Filled 2012-03-11: qty 250

## 2012-03-11 MED ORDER — ALBUMIN HUMAN 5 % IV SOLN
250.0000 mL | INTRAVENOUS | Status: AC | PRN
  Administered 2012-03-11 (×2): 250 mL via INTRAVENOUS

## 2012-03-11 MED ORDER — SODIUM CHLORIDE 0.9 % IJ SOLN
3.0000 mL | Freq: Two times a day (BID) | INTRAMUSCULAR | Status: DC
  Administered 2012-03-12 – 2012-03-13 (×4): 3 mL via INTRAVENOUS

## 2012-03-11 MED ORDER — SODIUM CHLORIDE 0.9 % IV SOLN
INTRAVENOUS | Status: DC
  Administered 2012-03-11: 14:00:00 via INTRAVENOUS

## 2012-03-11 MED ORDER — ACETAMINOPHEN 160 MG/5ML PO SOLN: 650.0000 mg | ORAL | Status: DC

## 2012-03-11 MED ORDER — ASPIRIN 81 MG PO CHEW: 324.0000 mg | CHEWABLE_TABLET | Freq: Every day | ORAL | Status: DC

## 2012-03-11 MED ORDER — VANCOMYCIN HCL IN DEXTROSE 1-5 GM/200ML-% IV SOLN
1000.0000 mg | Freq: Once | INTRAVENOUS | Status: AC
  Administered 2012-03-11: 1000 mg via INTRAVENOUS
  Filled 2012-03-11: qty 200

## 2012-03-11 MED ORDER — PHENYLEPHRINE HCL 10 MG/ML IJ SOLN: 0.0000 ug/min | INTRAVENOUS | Status: DC

## 2012-03-11 MED ORDER — SODIUM CHLORIDE 0.9 % IV SOLN
INTRAVENOUS | Status: DC | PRN
  Administered 2012-03-11 (×2): via INTRAVENOUS

## 2012-03-11 MED ORDER — SODIUM CHLORIDE 0.9 % IJ SOLN: 3.0000 mL | Freq: Two times a day (BID) | INTRAMUSCULAR | Status: DC

## 2012-03-11 MED ORDER — SODIUM BICARBONATE 8.4 % IV SOLN
INTRAVENOUS | Status: AC
  Filled 2012-03-11: qty 50

## 2012-03-11 MED ORDER — ASPIRIN EC 325 MG PO TBEC: 325.0000 mg | DELAYED_RELEASE_TABLET | Freq: Every day | ORAL | Status: DC

## 2012-03-11 MED ORDER — LACTATED RINGERS IV SOLN: INTRAVENOUS | Status: DC

## 2012-03-11 MED ORDER — PHENYLEPHRINE HCL 10 MG/ML IJ SOLN
20.0000 mg | INTRAVENOUS | Status: DC | PRN
  Administered 2012-03-11: 15 ug/min via INTRAVENOUS

## 2012-03-11 MED ORDER — SODIUM CHLORIDE 0.45 % IV SOLN: INTRAVENOUS | Status: DC

## 2012-03-11 MED ORDER — BISACODYL 5 MG PO TBEC
10.0000 mg | DELAYED_RELEASE_TABLET | Freq: Every day | ORAL | Status: DC
  Administered 2012-03-12 – 2012-03-16 (×4): 10 mg via ORAL
  Filled 2012-03-11 (×4): qty 2

## 2012-03-11 MED ORDER — ASPIRIN EC 325 MG PO TBEC
325.0000 mg | DELAYED_RELEASE_TABLET | Freq: Every day | ORAL | Status: DC
  Administered 2012-03-12 – 2012-03-16 (×5): 325 mg via ORAL
  Filled 2012-03-11 (×5): qty 1

## 2012-03-11 MED ORDER — ACETAMINOPHEN 500 MG PO TABS
1000.0000 mg | ORAL_TABLET | Freq: Four times a day (QID) | ORAL | Status: DC
  Administered 2012-03-12 – 2012-03-16 (×16): 1000 mg via ORAL
  Filled 2012-03-11 (×18): qty 2

## 2012-03-11 MED ORDER — METOPROLOL TARTRATE 12.5 MG HALF TABLET: 12.5000 mg | ORAL_TABLET | Freq: Two times a day (BID) | ORAL | Status: DC

## 2012-03-11 MED ORDER — CEFUROXIME SODIUM 1.5 G IJ SOLR: 1.5000 g | Freq: Two times a day (BID) | INTRAMUSCULAR | Status: DC

## 2012-03-11 MED ORDER — MORPHINE SULFATE 2 MG/ML IJ SOLN: 1.0000 mg | INTRAMUSCULAR | Status: DC | PRN

## 2012-03-11 MED ORDER — METOPROLOL TARTRATE 12.5 MG HALF TABLET
12.5000 mg | ORAL_TABLET | Freq: Two times a day (BID) | ORAL | Status: DC
  Filled 2012-03-11: qty 1

## 2012-03-11 MED ORDER — MORPHINE SULFATE 2 MG/ML IJ SOLN
2.0000 mg | INTRAMUSCULAR | Status: DC | PRN
  Filled 2012-03-11: qty 1

## 2012-03-11 MED ORDER — PHENYLEPHRINE HCL 10 MG/ML IJ SOLN
10.0000 mg | INTRAVENOUS | Status: DC | PRN
  Administered 2012-03-11: 10 ug/min via INTRAVENOUS

## 2012-03-11 MED ORDER — VANCOMYCIN HCL IN DEXTROSE 1-5 GM/200ML-% IV SOLN: 1000.0000 mg | Freq: Once | INTRAVENOUS | Status: DC

## 2012-03-11 MED ORDER — INSULIN REGULAR HUMAN 100 UNIT/ML IJ SOLN: INTRAMUSCULAR | Status: DC

## 2012-03-11 MED ORDER — HETASTARCH-ELECTROLYTES 6 % IV SOLN
INTRAVENOUS | Status: DC | PRN
  Administered 2012-03-11: 14:00:00 via INTRAVENOUS

## 2012-03-11 MED ORDER — LACTATED RINGERS IV SOLN
INTRAVENOUS | Status: DC | PRN
  Administered 2012-03-11 (×3): via INTRAVENOUS

## 2012-03-11 MED ORDER — TRANEXAMIC ACID 100 MG/ML IV SOLN
2500.0000 mg | INTRAVENOUS | Status: DC | PRN
  Administered 2012-03-11: 0.5 mg/kg/h via INTRAVENOUS

## 2012-03-11 MED ORDER — CEFUROXIME SODIUM 1.5 G IJ SOLR
1.5000 g | Freq: Two times a day (BID) | INTRAMUSCULAR | Status: AC
  Administered 2012-03-12 – 2012-03-13 (×4): 1.5 g via INTRAVENOUS
  Filled 2012-03-11 (×4): qty 1.5

## 2012-03-11 MED ORDER — DOPAMINE-DEXTROSE 3.2-5 MG/ML-% IV SOLN: 0.0000 ug/kg/min | INTRAVENOUS | Status: DC

## 2012-03-11 MED ORDER — LACTATED RINGERS IV SOLN: 500.0000 mL | Freq: Once | INTRAVENOUS | Status: AC | PRN

## 2012-03-11 MED ORDER — INSULIN REGULAR BOLUS VIA INFUSION
0.0000 [IU] | Freq: Three times a day (TID) | INTRAVENOUS | Status: DC
  Filled 2012-03-11: qty 10

## 2012-03-11 MED ORDER — DOCUSATE SODIUM 100 MG PO CAPS: 200.0000 mg | ORAL_CAPSULE | Freq: Every day | ORAL | Status: DC

## 2012-03-11 MED ORDER — ACETAMINOPHEN 500 MG PO TABS: 1000.0000 mg | ORAL_TABLET | Freq: Four times a day (QID) | ORAL | Status: DC

## 2012-03-11 MED ORDER — HEPARIN SODIUM (PORCINE) 1000 UNIT/ML IJ SOLN
INTRAMUSCULAR | Status: DC | PRN
  Administered 2012-03-11: 33000 [IU] via INTRAVENOUS
  Administered 2012-03-11: 2000 [IU] via INTRAVENOUS

## 2012-03-11 MED ORDER — BISACODYL 10 MG RE SUPP: 10.0000 mg | Freq: Every day | RECTAL | Status: DC

## 2012-03-11 MED ORDER — SODIUM CHLORIDE 0.9 % IJ SOLN
OROMUCOSAL | Status: DC | PRN
  Administered 2012-03-11 (×3): via TOPICAL

## 2012-03-11 MED ORDER — FAMOTIDINE IN NACL 20-0.9 MG/50ML-% IV SOLN: 20.0000 mg | Freq: Two times a day (BID) | INTRAVENOUS | Status: DC

## 2012-03-11 MED ORDER — MIDAZOLAM HCL 2 MG/2ML IJ SOLN: 2.0000 mg | INTRAMUSCULAR | Status: DC | PRN

## 2012-03-11 MED ORDER — MAGNESIUM SULFATE 40 MG/ML IJ SOLN: 4.0000 g | Freq: Once | INTRAMUSCULAR | Status: DC

## 2012-03-11 MED ORDER — ACETAMINOPHEN 160 MG/5ML PO SOLN: 975.0000 mg | Freq: Four times a day (QID) | ORAL | Status: DC

## 2012-03-11 MED ORDER — SODIUM CHLORIDE 0.9 % IV SOLN
INTRAVENOUS | Status: DC
  Administered 2012-03-11: 1.9 [IU]/h via INTRAVENOUS
  Filled 2012-03-11: qty 1

## 2012-03-11 MED ORDER — SODIUM CHLORIDE 0.9 % IV SOLN
200.0000 ug | INTRAVENOUS | Status: DC | PRN
  Administered 2012-03-11: 0.3 ug/kg/h via INTRAVENOUS

## 2012-03-11 MED ORDER — SODIUM CHLORIDE 0.9 % IV SOLN: INTRAVENOUS | Status: DC

## 2012-03-11 MED ORDER — CHLORHEXIDINE GLUCONATE 4 % EX LIQD: 30.0000 mL | CUTANEOUS | Status: DC

## 2012-03-11 MED ORDER — MORPHINE SULFATE 2 MG/ML IJ SOLN: 1.0000 mg | INTRAMUSCULAR | Status: AC | PRN

## 2012-03-11 MED ORDER — FAMOTIDINE IN NACL 20-0.9 MG/50ML-% IV SOLN
20.0000 mg | Freq: Two times a day (BID) | INTRAVENOUS | Status: AC
  Administered 2012-03-11: 20 mg via INTRAVENOUS

## 2012-03-11 MED ORDER — METOPROLOL TARTRATE 25 MG/10 ML ORAL SUSPENSION: 12.5000 mg | Freq: Two times a day (BID) | ORAL | Status: DC

## 2012-03-11 MED ORDER — PHENYLEPHRINE HCL 10 MG/ML IJ SOLN
0.0000 ug/min | INTRAMUSCULAR | Status: DC
  Filled 2012-03-11: qty 2

## 2012-03-11 MED ORDER — SODIUM CHLORIDE 0.9 % IJ SOLN: 3.0000 mL | INTRAMUSCULAR | Status: DC | PRN

## 2012-03-11 MED ORDER — ALBUMIN HUMAN 5 % IV SOLN: 250.0000 mL | INTRAVENOUS | Status: DC | PRN

## 2012-03-11 MED ORDER — DOPAMINE-DEXTROSE 3.2-5 MG/ML-% IV SOLN
INTRAVENOUS | Status: DC | PRN
  Administered 2012-03-11: 3 ug/kg/min via INTRAVENOUS

## 2012-03-11 MED ORDER — BISACODYL 5 MG PO TBEC: 10.0000 mg | DELAYED_RELEASE_TABLET | Freq: Every day | ORAL | Status: DC

## 2012-03-11 MED ORDER — METOPROLOL TARTRATE 12.5 MG HALF TABLET
12.5000 mg | ORAL_TABLET | Freq: Two times a day (BID) | ORAL | Status: DC
  Administered 2012-03-12: 12.5 mg via ORAL
  Filled 2012-03-11 (×3): qty 1

## 2012-03-11 MED ORDER — ACETAMINOPHEN 650 MG RE SUPP
650.0000 mg | RECTAL | Status: AC
  Administered 2012-03-11: 650 mg via RECTAL

## 2012-03-11 MED ORDER — NITROGLYCERIN IN D5W 200-5 MCG/ML-% IV SOLN: 0.0000 ug/min | INTRAVENOUS | Status: DC

## 2012-03-11 MED ORDER — 0.9 % SODIUM CHLORIDE (POUR BTL) OPTIME
TOPICAL | Status: DC | PRN
  Administered 2012-03-11: 5000 mL
  Administered 2012-03-11: 1000 mL

## 2012-03-11 MED ORDER — METOPROLOL TARTRATE 25 MG/10 ML ORAL SUSPENSION
12.5000 mg | Freq: Two times a day (BID) | ORAL | Status: DC
  Filled 2012-03-11: qty 5

## 2012-03-11 MED ORDER — MIDAZOLAM HCL 5 MG/5ML IJ SOLN
INTRAMUSCULAR | Status: DC | PRN
  Administered 2012-03-11: 2 mg via INTRAVENOUS
  Administered 2012-03-11: 1 mg via INTRAVENOUS
  Administered 2012-03-11: 2 mg via INTRAVENOUS
  Administered 2012-03-11: 3 mg via INTRAVENOUS
  Administered 2012-03-11 (×2): 1 mg via INTRAVENOUS
  Administered 2012-03-11 (×2): 2 mg via INTRAVENOUS

## 2012-03-11 MED ORDER — ALBUMIN HUMAN 5 % IV SOLN
INTRAVENOUS | Status: DC | PRN
  Administered 2012-03-11: 12:00:00 via INTRAVENOUS

## 2012-03-11 MED ORDER — METOPROLOL TARTRATE 1 MG/ML IV SOLN: 2.5000 mg | INTRAVENOUS | Status: DC | PRN

## 2012-03-11 MED ORDER — PROPOFOL 10 MG/ML IV EMUL
INTRAVENOUS | Status: DC | PRN
  Administered 2012-03-11: 60 mg via INTRAVENOUS

## 2012-03-11 MED ORDER — OXYCODONE HCL 5 MG PO TABS: 5.0000 mg | ORAL_TABLET | ORAL | Status: DC | PRN

## 2012-03-11 MED ORDER — SODIUM CHLORIDE 0.9 % IV SOLN
100.0000 [IU] | INTRAVENOUS | Status: DC | PRN
  Administered 2012-03-11: 2.9 [IU]/h via INTRAVENOUS

## 2012-03-11 MED ORDER — POTASSIUM CHLORIDE 10 MEQ/50ML IV SOLN: 10.0000 meq | INTRAVENOUS | Status: DC

## 2012-03-11 MED ORDER — MORPHINE SULFATE 2 MG/ML IJ SOLN: 2.0000 mg | INTRAMUSCULAR | Status: DC | PRN

## 2012-03-11 MED ORDER — DOCUSATE SODIUM 100 MG PO CAPS
200.0000 mg | ORAL_CAPSULE | Freq: Every day | ORAL | Status: DC
  Administered 2012-03-12 – 2012-03-16 (×4): 200 mg via ORAL
  Filled 2012-03-11 (×5): qty 2

## 2012-03-11 MED ORDER — PANTOPRAZOLE SODIUM 40 MG PO TBEC: 40.0000 mg | DELAYED_RELEASE_TABLET | Freq: Every day | ORAL | Status: DC

## 2012-03-11 MED ORDER — POTASSIUM CHLORIDE 10 MEQ/50ML IV SOLN
10.0000 meq | INTRAVENOUS | Status: AC
  Administered 2012-03-11 (×3): 10 meq via INTRAVENOUS

## 2012-03-11 MED ORDER — FENTANYL CITRATE 0.05 MG/ML IJ SOLN
INTRAMUSCULAR | Status: DC | PRN
  Administered 2012-03-11: 100 ug via INTRAVENOUS
  Administered 2012-03-11: 50 ug via INTRAVENOUS
  Administered 2012-03-11: 500 ug via INTRAVENOUS
  Administered 2012-03-11 (×2): 100 ug via INTRAVENOUS
  Administered 2012-03-11: 350 ug via INTRAVENOUS
  Administered 2012-03-11: 50 ug via INTRAVENOUS
  Administered 2012-03-11 (×2): 100 ug via INTRAVENOUS
  Administered 2012-03-11 (×2): 150 ug via INTRAVENOUS

## 2012-03-11 MED ORDER — PHENYLEPHRINE HCL 10 MG/ML IJ SOLN: 20.0000 mg | INTRAVENOUS | Status: DC | PRN

## 2012-03-11 MED ORDER — ONDANSETRON HCL 4 MG/2ML IJ SOLN: 4.0000 mg | Freq: Four times a day (QID) | INTRAMUSCULAR | Status: DC | PRN

## 2012-03-11 MED ORDER — ACETAMINOPHEN 160 MG/5ML PO SOLN: 650.0000 mg | ORAL | Status: AC

## 2012-03-11 MED ORDER — DEXMEDETOMIDINE HCL IN NACL 200 MCG/50ML IV SOLN: 0.1000 ug/kg/h | INTRAVENOUS | Status: DC

## 2012-03-11 MED ORDER — SODIUM BICARBONATE 8.4 % IV SOLN
50.0000 meq | Freq: Once | INTRAVENOUS | Status: AC
  Administered 2012-03-11: 50 meq via INTRAVENOUS
  Filled 2012-03-11: qty 50

## 2012-03-11 MED ORDER — MAGNESIUM SULFATE 40 MG/ML IJ SOLN
4.0000 g | Freq: Once | INTRAMUSCULAR | Status: AC
  Administered 2012-03-11: 4 g via INTRAVENOUS
  Filled 2012-03-11: qty 100

## 2012-03-11 MED ORDER — DEXMEDETOMIDINE HCL IN NACL 200 MCG/50ML IV SOLN
0.1000 ug/kg/h | INTRAVENOUS | Status: DC
  Administered 2012-03-11: 0.7 ug/kg/h via INTRAVENOUS
  Administered 2012-03-12: 0.3 ug/kg/h via INTRAVENOUS
  Filled 2012-03-11 (×3): qty 50

## 2012-03-11 MED ORDER — ROCURONIUM BROMIDE 100 MG/10ML IV SOLN
INTRAVENOUS | Status: DC | PRN
  Administered 2012-03-11 (×2): 30 mg via INTRAVENOUS
  Administered 2012-03-11: 50 mg via INTRAVENOUS
  Administered 2012-03-11: 20 mg via INTRAVENOUS
  Administered 2012-03-11: 50 mg via INTRAVENOUS

## 2012-03-11 MED ORDER — INSULIN REGULAR BOLUS VIA INFUSION: 0.0000 [IU] | Freq: Three times a day (TID) | INTRAVENOUS | Status: DC

## 2012-03-11 MED ORDER — NITROGLYCERIN IN D5W 200-5 MCG/ML-% IV SOLN
INTRAVENOUS | Status: DC | PRN
  Administered 2012-03-11: 16.6 ug/min via INTRAVENOUS

## 2012-03-11 MED ORDER — ACETAMINOPHEN 650 MG RE SUPP: 650.0000 mg | RECTAL | Status: DC

## 2012-03-11 SURGICAL SUPPLY — 118 items
ADAPTER CARDIO PERF ANTE/RETRO (ADAPTER) IMPLANT
APPLIER CLIP 9.375 MED OPEN (MISCELLANEOUS)
APPLIER CLIP 9.375 SM OPEN (CLIP)
ATTRACTOMAT 16X20 MAGNETIC DRP (DRAPES) ×2 IMPLANT
BAG DECANTER FOR FLEXI CONT (MISCELLANEOUS) ×2 IMPLANT
BANDAGE ELASTIC 4 VELCRO ST LF (GAUZE/BANDAGES/DRESSINGS) ×2 IMPLANT
BANDAGE ELASTIC 6 VELCRO ST LF (GAUZE/BANDAGES/DRESSINGS) ×2 IMPLANT
BANDAGE GAUZE ELAST BULKY 4 IN (GAUZE/BANDAGES/DRESSINGS) ×2 IMPLANT
BASKET HEART (ORDER IN 25'S) (MISCELLANEOUS) ×1
BASKET HEART (ORDER IN 25S) (MISCELLANEOUS) ×1 IMPLANT
BENZOIN TINCTURE PRP APPL 2/3 (GAUZE/BANDAGES/DRESSINGS) ×2 IMPLANT
BLADE STERNUM SYSTEM 6 (BLADE) ×2 IMPLANT
BLADE SURG 11 STRL SS (BLADE) ×2 IMPLANT
BLADE SURG ROTATE 9660 (MISCELLANEOUS) ×2 IMPLANT
CANISTER SUCTION 2500CC (MISCELLANEOUS) ×2 IMPLANT
CANNULA GUNDRY RCSP 15FR (MISCELLANEOUS) IMPLANT
CANNULA SOFTFLOW AORTIC 7M21FR (CANNULA) ×2 IMPLANT
CATH CPB KIT OWEN (MISCELLANEOUS) ×2 IMPLANT
CATH THORACIC 28FR (CATHETERS) IMPLANT
CATH THORACIC 28FR RT ANG (CATHETERS) IMPLANT
CATH THORACIC 36FR (CATHETERS) ×2 IMPLANT
CATH THORACIC 36FR RT ANG (CATHETERS) IMPLANT
CLIP APPLIE 9.375 MED OPEN (MISCELLANEOUS) IMPLANT
CLIP APPLIE 9.375 SM OPEN (CLIP) IMPLANT
CLIP FOGARTY SPRING 6M (CLIP) ×2 IMPLANT
CLIP TI MEDIUM 24 (CLIP) IMPLANT
CLIP TI WIDE RED SMALL 24 (CLIP) IMPLANT
CLOTH BEACON ORANGE TIMEOUT ST (SAFETY) ×2 IMPLANT
CONN Y 3/8X3/8X3/8  BEN (MISCELLANEOUS)
CONN Y 3/8X3/8X3/8 BEN (MISCELLANEOUS) IMPLANT
COVER SURGICAL LIGHT HANDLE (MISCELLANEOUS) ×2 IMPLANT
CRADLE DONUT ADULT HEAD (MISCELLANEOUS) ×2 IMPLANT
DRAIN CHANNEL 32F RND 10.7 FF (WOUND CARE) ×2 IMPLANT
DRAPE CARDIOVASCULAR INCISE (DRAPES) ×1
DRAPE SLUSH/WARMER DISC (DRAPES) ×2 IMPLANT
DRAPE SRG 135X102X78XABS (DRAPES) ×1 IMPLANT
DRSG COVADERM 4X14 (GAUZE/BANDAGES/DRESSINGS) ×2 IMPLANT
ELECT REM PT RETURN 9FT ADLT (ELECTROSURGICAL) ×4
ELECTRODE REM PT RTRN 9FT ADLT (ELECTROSURGICAL) ×2 IMPLANT
GAUZE SPONGE 4X4 16PLY XRAY LF (GAUZE/BANDAGES/DRESSINGS) ×2 IMPLANT
GLOVE BIO SURGEON STRL SZ 6 (GLOVE) ×12 IMPLANT
GLOVE BIO SURGEON STRL SZ 6.5 (GLOVE) ×4 IMPLANT
GLOVE BIO SURGEON STRL SZ7 (GLOVE) IMPLANT
GLOVE BIO SURGEON STRL SZ7.5 (GLOVE) IMPLANT
GLOVE BIOGEL PI IND STRL 6 (GLOVE) ×1 IMPLANT
GLOVE BIOGEL PI IND STRL 6.5 (GLOVE) ×2 IMPLANT
GLOVE BIOGEL PI IND STRL 7.0 (GLOVE) IMPLANT
GLOVE BIOGEL PI INDICATOR 6 (GLOVE) ×1
GLOVE BIOGEL PI INDICATOR 6.5 (GLOVE) ×2
GLOVE BIOGEL PI INDICATOR 7.0 (GLOVE)
GLOVE EUDERMIC 7 POWDERFREE (GLOVE) IMPLANT
GLOVE ORTHO TXT STRL SZ7.5 (GLOVE) ×4 IMPLANT
GOWN PREVENTION PLUS XLARGE (GOWN DISPOSABLE) ×4 IMPLANT
GOWN STRL NON-REIN LRG LVL3 (GOWN DISPOSABLE) ×12 IMPLANT
HEMOSTAT POWDER SURGIFOAM 1G (HEMOSTASIS) ×6 IMPLANT
INSERT FOGARTY 61MM (MISCELLANEOUS) IMPLANT
INSERT FOGARTY XLG (MISCELLANEOUS) ×2 IMPLANT
KIT BASIN OR (CUSTOM PROCEDURE TRAY) ×2 IMPLANT
KIT CATH SUCT 8FR (CATHETERS) IMPLANT
KIT ROOM TURNOVER OR (KITS) ×2 IMPLANT
KIT SUCTION CATH 14FR (SUCTIONS) ×6 IMPLANT
KIT VASOVIEW W/TROCAR VH 2000 (KITS) ×2 IMPLANT
LEAD PACING MYOCARDI (MISCELLANEOUS) ×2 IMPLANT
MARKER GRAFT CORONARY BYPASS (MISCELLANEOUS) ×6 IMPLANT
NS IRRIG 1000ML POUR BTL (IV SOLUTION) ×12 IMPLANT
PACK OPEN HEART (CUSTOM PROCEDURE TRAY) ×2 IMPLANT
PAD ARMBOARD 7.5X6 YLW CONV (MISCELLANEOUS) ×2 IMPLANT
PENCIL BUTTON HOLSTER BLD 10FT (ELECTRODE) ×2 IMPLANT
PUNCH AORTIC ROTATE 4.0MM (MISCELLANEOUS) ×2 IMPLANT
PUNCH AORTIC ROTATE 4.5MM 8IN (MISCELLANEOUS) IMPLANT
PUNCH AORTIC ROTATE 5MM 8IN (MISCELLANEOUS) IMPLANT
SET CARDIOPLEGIA MPS 5001102 (MISCELLANEOUS) ×2 IMPLANT
SOLUTION ANTI FOG 6CC (MISCELLANEOUS) IMPLANT
SPONGE GAUZE 4X4 12PLY (GAUZE/BANDAGES/DRESSINGS) ×4 IMPLANT
SPONGE LAP 18X18 X RAY DECT (DISPOSABLE) ×2 IMPLANT
SPONGE LAP 4X18 X RAY DECT (DISPOSABLE) IMPLANT
STRIP CLOSURE SKIN 1/2X4 (GAUZE/BANDAGES/DRESSINGS) ×2 IMPLANT
SUT BONE WAX W31G (SUTURE) ×2 IMPLANT
SUT ETHIBOND X763 2 0 SH 1 (SUTURE) ×4 IMPLANT
SUT MNCRL AB 3-0 PS2 18 (SUTURE) ×4 IMPLANT
SUT MNCRL AB 4-0 PS2 18 (SUTURE) ×2 IMPLANT
SUT PDS AB 1 CTX 36 (SUTURE) ×4 IMPLANT
SUT PROLENE 2 0 SH DA (SUTURE) IMPLANT
SUT PROLENE 3 0 SH DA (SUTURE) ×2 IMPLANT
SUT PROLENE 3 0 SH1 36 (SUTURE) IMPLANT
SUT PROLENE 4 0 RB 1 (SUTURE)
SUT PROLENE 4 0 SH DA (SUTURE) IMPLANT
SUT PROLENE 4-0 RB1 .5 CRCL 36 (SUTURE) IMPLANT
SUT PROLENE 5 0 C 1 36 (SUTURE) IMPLANT
SUT PROLENE 6 0 C 1 30 (SUTURE) ×2 IMPLANT
SUT PROLENE 7.0 RB 3 (SUTURE) ×14 IMPLANT
SUT PROLENE 8 0 BV175 6 (SUTURE) ×2 IMPLANT
SUT PROLENE BLUE 7 0 (SUTURE) ×4 IMPLANT
SUT PROLENE POLY MONO (SUTURE) IMPLANT
SUT SILK  1 MH (SUTURE) ×2
SUT SILK 1 MH (SUTURE) ×2 IMPLANT
SUT STEEL 6MS V (SUTURE) ×2 IMPLANT
SUT STEEL STERNAL CCS#1 18IN (SUTURE) IMPLANT
SUT STEEL SZ 6 DBL 3X14 BALL (SUTURE) ×4 IMPLANT
SUT VIC AB 1 CTX 36 (SUTURE)
SUT VIC AB 1 CTX36XBRD ANBCTR (SUTURE) IMPLANT
SUT VIC AB 2-0 CT1 27 (SUTURE) ×1
SUT VIC AB 2-0 CT1 TAPERPNT 27 (SUTURE) ×1 IMPLANT
SUT VIC AB 2-0 CTX 27 (SUTURE) IMPLANT
SUT VIC AB 3-0 SH 27 (SUTURE)
SUT VIC AB 3-0 SH 27X BRD (SUTURE) IMPLANT
SUT VIC AB 3-0 X1 27 (SUTURE) IMPLANT
SUT VICRYL 4-0 PS2 18IN ABS (SUTURE) IMPLANT
SUTURE E-PAK OPEN HEART (SUTURE) ×2 IMPLANT
SYSTEM SAHARA CHEST DRAIN ATS (WOUND CARE) ×2 IMPLANT
TAPE CLOTH SURG 4X10 WHT LF (GAUZE/BANDAGES/DRESSINGS) ×4 IMPLANT
TOWEL OR 17X24 6PK STRL BLUE (TOWEL DISPOSABLE) ×4 IMPLANT
TOWEL OR 17X26 10 PK STRL BLUE (TOWEL DISPOSABLE) ×4 IMPLANT
TRAY FOLEY IC TEMP SENS 14FR (CATHETERS) ×2 IMPLANT
TUBE SUCT INTRACARD DLP 20F (MISCELLANEOUS) ×2 IMPLANT
TUBING INSUFFLATION 10FT LAP (TUBING) ×2 IMPLANT
UNDERPAD 30X30 INCONTINENT (UNDERPADS AND DIAPERS) ×2 IMPLANT
WATER STERILE IRR 1000ML POUR (IV SOLUTION) ×4 IMPLANT

## 2012-03-11 NOTE — Preoperative (Signed)
Beta Blockers   Reason not to administer Beta Blockers:Not Applicable 

## 2012-03-11 NOTE — Interval H&P Note (Signed)
History and Physical Interval Note:  03/11/2012 7:41 AM  [name] [name]  has presented today for surgery, with the diagnosis of CAD  The various methods of treatment have been discussed with the patient and family. After consideration of risks, benefits and other options for treatment, the patient has consented to  Procedure(s) (LRB): CORONARY ARTERY BYPASS GRAFTING (CABG) (N/A) as a surgical intervention .  The patient's history has been reviewed, patient examined, no change in status, stable for surgery.  I have reviewed the patient's chart and labs.  Questions were answered to the patient's satisfaction.     [name],[name]

## 2012-03-11 NOTE — Anesthesia Postprocedure Evaluation (Signed)
  Anesthesia Post-op Note  Patient: [name] [name]  Procedure(s) Performed: Procedure(s) (LRB): CORONARY ARTERY BYPASS GRAFTING (CABG) (N/A)  Patient Location: SICU  Anesthesia Type: General  Level of Consciousness: sedated  Airway and Oxygen Therapy: Patient remains intubated per anesthesia plan  Post-op Pain: none  Post-op Assessment: Patient's Cardiovascular Status Stable  Post-op Vital Signs: stable  Complications: No apparent anesthesia complications

## 2012-03-11 NOTE — Op Note (Signed)
CARDIOTHORACIC SURGERY OPERATIVE NOTE  Date of Procedure: 03/11/2012  Preoperative Diagnosis: Severe 3-vessel Coronary Artery Disease  Postoperative Diagnosis: Same  Procedure:   Coronary Artery Bypass Grafting x 4  Left Internal Mammary Artery to Mid Left Anterior Descending Coronary Artery  Sapheonous Vein Graft to Distal Left Anterior Descending Coronary Artery  Saphenous Vein Graft to Posterior Descending Coronary Artery  Saphenous Vein Graft to Obtuse Marginal Branch of Left Circumflex Coronary Artery  Endoscopic Vein Harvest from Right Thigh and Lower Leg  Surgeon: Salvatore Decent. Cornelius Moras, MD  Assistant: Coral Ceo, PA-C  Anesthesia: Kipp Brood, MD   Operative Findings:  Normal LV systolic function   Moderate LV hypertrophy   Good quality LIMA conduit   Fair quality SVG conduit   Diffusely diseased and calcified distal vessels with fair to poor targets for grafting     BRIEF CLINICAL NOTE AND INDICATIONS FOR SURGERY  Patient is a 78 year old gentleman from [place] with no previous history of coronary artery disease but risk factors notable for history of hypertension, type 2 diabetes mellitus, and hyperlipidemia. The patient describes a 2 year history of progressive symptoms of exertional angina. The patient gets dull substernal chest pain with physical activity that is always relieved by rest. Symptoms are associated with nausea and shortness of breath. The patient denies any prolonged episodes of chest pain lasting more than several minutes. He has otherwise remained reasonably active physically. He recently underwent a nuclear stress test that was notable for the absence of EKG changes but perfusion images were highly suspicious for stress-induced ischemia. The patient subsequent underwent cardiac catheterization demonstrating severe three-vessel coronary artery disease with preserved left ventricular function. The patient was referred to consider elective  surgical revascularization.  The patient has been seen in consultation and counseled at length regarding the indications, risks and potential benefits of surgery.  All questions have been answered, and the patient provides full informed consent for the operation as described.     DETAILS OF THE OPERATIVE PROCEDURE  The patient is brought to the operating room on the above mentioned date and central monitoring was established by the anesthesia team including placement of Swan-Ganz catheter and radial arterial line. The patient is placed in the supine position on the operating table.  Intravenous antibiotics are administered. General endotracheal anesthesia is induced uneventfully. A Foley catheter is placed.  Baseline transesophageal echocardiogram was performed.  Findings were notable for normal LV systolic function.  There was moderate LV hypertrophy.  The patient's chest, abdomen, both groins, and both lower extremities are prepared and draped in a sterile manner. A time out procedure is performed.  A median sternotomy incision was performed and the left internal mammary artery is dissected from the chest wall and prepared for bypass grafting. The left internal mammary artery is notably good quality conduit. Simultaneously, saphenous vein is obtained from the patient's right thigh and leg using endoscopic vein harvest technique. The saphenous vein is notably fair quality conduit. After removal of the saphenous vein, the small surgical incisions in the lower extremity are closed with absorbable suture. Following systemic heparinization, the left internal mammary artery was transected distally noted to have excellent flow.  The pericardium is opened. The ascending aorta is moderately diseased with atherosclerosis. The ascending aorta and the right atrium are cannulated for cardioplegia bypass.  Adequate heparinization is verified.    The entire pre-bypass portion of the operation was notable for  stable hemodynamics.  Cardiopulmonary bypass was begun and the surface of the heart  is inspected. Distal target vessels are selected for coronary artery bypass grafting. A cardioplegia cannula is placed in the ascending aorta.  A temperature probe was placed in the interventricular septum.  The patient is allowed to cool passively to [facility] systemic temperature.  The aortic cross clamp is applied and cold blood cardioplegia is delivered initially in an antegrade fashion through the aortic root.  Iced saline slush is applied for topical hypothermia.  The initial cardioplegic arrest is rapid with early diastolic arrest.  Repeat doses of cardioplegia are administered intermittently throughout the entire cross clamp portion of the operation through the aortic root and through subsequently placed vein grafts in order to maintain completely flat electrocardiogram and septal myocardial temperature below 15C.  Myocardial protection was felt to be excellent.  The following distal coronary artery bypass grafts were performed:   The posterior descending branch of the right coronary artery was grafted using a reversed saphenous vein graft in an end-to-side fashion.  At the site of distal anastomosis the target vessel was fair quality and measured approximately 1.5 mm in diameter.  The obtuse marginal branch of the left circumflex coronary artery was grafted using a reversed saphenous vein graft in an end-to-side fashion.  At the site of distal anastomosis the target vessel was fair quality and measured approximately 1.3 mm in diameter.  The distal left anterior descending coronary artery was grafted using a reversed saphenous vein graft in an end-to-side fashion close to the LV apex.  At the site of distal anastomosis the target vessel was fair to poor quality and measured approximately 1.8 mm in diameter.  The mid left anterior coronary artery was grafted with the left internal mammary artery in an end-to-side  fashion.  At the site of distal anastomosis the target vessel was fair to poor quality and measured approximately 2.0 mm in diameter.   All proximal vein graft anastomoses were placed directly to the ascending aorta prior to removal of the aortic cross clamp.  The septal myocardial temperature rose rapidly after reperfusion of the left internal mammary artery graft.  The aortic cross clamp was removed after a total cross clamp time of 86 minutes.  All proximal and distal coronary anastomoses were inspected for hemostasis and appropriate graft orientation. Epicardial pacing wires are fixed to the right ventricular outflow tract and to the right atrial appendage. The patient is rewarmed to 37C temperature. The patient is weaned and disconnected from cardiopulmonary bypass.  The patient's rhythm at separation from bypass was sinus rhythm.  The patient was weaned from cardioplegic bypass on renal dose dopamine at 3 mcg/kg/min because of preexisting chronic renal insufficiency. Total cardiopulmonary bypass time for the operation was 108 minutes.  Followup transesophageal echocardiogram performed after separation from bypass revealed no changes from the preoperative exam.  The aortic and venous cannula were removed uneventfully. Protamine was administered to reverse the anticoagulation. The mediastinum and pleural space were inspected for hemostasis and irrigated with saline solution. The mediastinum and the left pleural space were drained using 3 chest tubes placed through separate stab incisions inferiorly.  The soft tissues anterior to the aorta were reapproximated loosely. The sternum is closed with double strength sternal wire. The soft tissues anterior to the sternum were closed in multiple layers and the skin is closed with a running subcuticular skin closure.  The post-bypass portion of the operation was notable for stable rhythm and hemodynamics.  No blood products were administered during the  operation.  The patient tolerated the procedure  well and is transported to the surgical intensive care in stable condition. There are no intraoperative complications. All sponge instrument and needle counts are verified correct at completion of the operation.    Salvatore Decent. Cornelius Moras MD 03/11/2012 1:22 PM

## 2012-03-11 NOTE — Procedures (Signed)
Extubation Procedure Note  Patient Details:   Name: [name] [name] DOB: [birth year] MRN: [id number]   Airway Documentation:     Evaluation  O2 sats: stable throughout and currently acceptable Complications: No apparent complications Patient did tolerate procedure well. BIL BS: Clear/ Diminished.   Yes  Antoine Poche 03/11/2012, 6:25 PM

## 2012-03-11 NOTE — Progress Notes (Signed)
RT assessment completed.  Pt noted to have hx of OSA.  Pt states that he does not wear CPAP at home and will not wear here.  At this time I do not think breathing treatments are necessary.  Pt encouraged to continue use of IS for atelectasis.

## 2012-03-11 NOTE — Addendum Note (Signed)
Addendum  created 03/11/12 1518 by Kipp Brood, MD   Modules edited:Notes Section

## 2012-03-11 NOTE — Progress Notes (Signed)
  Echocardiogram Echocardiogram Transesophageal has been performed.  [name] [name] 03/11/2012, 8:55 AM

## 2012-03-11 NOTE — Transfer of Care (Signed)
Immediate Anesthesia Transfer of Care Note  Patient: [name] [name]  Procedure(s) Performed: Procedure(s) (LRB): CORONARY ARTERY BYPASS GRAFTING (CABG) (N/A)  Patient Location: SICU  Anesthesia Type: General  Level of Consciousness: sedated  Airway & Oxygen Therapy: Patient remains intubated per anesthesia plan  Post-op Assessment: Post -op Vital signs reviewed and stable  Post vital signs: stable  Complications: No apparent anesthesia complications

## 2012-03-11 NOTE — Brief Op Note (Signed)
03/11/2012  11:13 AM  PATIENT:  [name] [name]  78 y.o. male  PRE-OPERATIVE DIAGNOSIS:  Coronary artery disease  POST-OPERATIVE DIAGNOSIS:  Coronary artery disease  PROCEDURE:  Procedure(s): CORONARY ARTERY BYPASS GRAFTING x 4 (LIMA-mid LAD, SVG-distal LAD, SVG-OM, SVG-PDA), EVH right leg  SURGEON:    Purcell Nails, MD  ASSISTANTS:  Coral Ceo, PA-C  ANESTHESIA:   Kipp Brood, MD  CROSSCLAMP TIME:   100'  CARDIOPULMONARY BYPASS TIME: 108'  FINDINGS:  Normal LV systolic function  Moderate LV hypertrophy  Good quality LIMA conduit  Fair quality SVG conduit  Diffusely diseased and calcified distal vessels with fair to poor targets for grafting  COMPLICATIONS: none  PATIENT DISPOSITION:   TO SICU IN STABLE CONDITION  [name] H 03/11/2012 12:45 PM

## 2012-03-11 NOTE — Anesthesia Preprocedure Evaluation (Signed)
Anesthesia Evaluation  Patient identified by MRN, date of birth, ID band Patient awake    Reviewed: Allergy & Precautions, H&P , NPO status , Patient's Chart, lab work & pertinent test results, reviewed documented beta blocker date and time   Airway Mallampati: II      Dental  (+) Teeth Intact   Pulmonary  breath sounds clear to auscultation        Cardiovascular Rhythm:Regular Rate:Normal     Neuro/Psych    GI/Hepatic   Endo/Other    Renal/GU      Musculoskeletal   Abdominal   Peds  Hematology   Anesthesia Other Findings   Reproductive/Obstetrics                           Anesthesia Physical Anesthesia Plan  ASA: III  Anesthesia Plan: General   Post-op Pain Management:    Induction: Intravenous  Airway Management Planned: Oral ETT  Additional Equipment: Arterial line, PA Cath, CVP, Ultrasound Guidance Line Placement and 3D TEE  Intra-op Plan:   Post-operative Plan: Post-operative intubation/ventilation  Informed Consent: I have reviewed the patients History and Physical, chart, labs and discussed the procedure including the risks, benefits and alternatives for the proposed anesthesia with the patient or authorized representative who has indicated his/her understanding and acceptance.   Dental advisory given  Plan Discussed with: CRNA and Surgeon  Anesthesia Plan Comments: (Three vessel CAD with presrved LV function Type 2 DM Renal insuff Cr 1.56 Obesity Sleep apnea Htn  Plan GA with TEE  Kipp Brood, MD   )        Anesthesia Quick Evaluation

## 2012-03-11 NOTE — Plan of Care (Signed)
Problem: Phase II Progression Outcomes Goal: Patient extubated within - Outcome: Completed/Met Date Met:  03/11/12 Extubated at 1755 at < 6 hours

## 2012-03-11 NOTE — Anesthesia Procedure Notes (Signed)
Procedure Name: Intubation Date/Time: 03/11/2012 8:08 AM Performed by: Ellin Goodie Pre-anesthesia Checklist: Patient identified, Emergency Drugs available, Suction available, Patient being monitored and Timeout performed Patient Re-evaluated:Patient Re-evaluated prior to inductionOxygen Delivery Method: Circle system utilized Preoxygenation: Pre-oxygenation with 100% oxygen Intubation Type: IV induction Ventilation: Oral airway inserted - appropriate to patient size and Mask ventilation without difficulty Laryngoscope Size: Mac and 3 Grade View: Grade II Tube type: Oral Tube size: 8.0 mm Number of attempts: 1 Airway Equipment and Method: Stylet Placement Confirmation: ETT inserted through vocal cords under direct vision,  positive ETCO2 and breath sounds checked- equal and bilateral Secured at: 23 cm Tube secured with: Tape Dental Injury: Teeth and Oropharynx as per pre-operative assessment

## 2012-03-11 NOTE — CV Procedure (Signed)
Intraoperative Transesophageal Echocardiography Note:  Mr. [name] [name] is a 78 year old male scheduled to undergo coronary artery bypass grafting by Dr. Cornelius Moras. He had an abnormal stress test at [facility] on 01/18/2012. He subsequently had a cardiac catheterization at [facility] on 02/09/2012 that demonstrated severe three-vessel coronary disease with preserved left ventricular function. Intraoperative transesophageal echocardiography was indicated to assess for any valvular pathology and to serve as a monitor for intraoperative volume and status cardiac function.  The patient was brought to the operating room at [facility] and general anesthesia was induced without difficulty. Following endotracheal intubation and gastric suctioning, the transesophageal echocardiography probe was inserted into the esophagus without difficulty.  Impression: Pre-bypass Findings:  1. Aortic Valve: The aortic valve had 3 leaflets. There was mild thickening of the leaflets but they opened normally and there was no aortic insufficiency.  2. Mitral valve: The mitral leaflets coapted normally and there were no flail  or prolapsing segments noted. There was trace mitral insufficiency. There was mild mitral annular calcification.  3. Left ventricle: There was normal left ventricular systolic function.  The LV size was normal and measured 4.26 cm at end diastole at the mid-papillary level in the transgastric short axis view. The posterior wall measured 1.05 cm and the anterior wall measured 1.2 cm in the same view. Left ventricular ejection fraction was estimated at 60%. No regional wall motion abnormalities were noted. 4. Right ventricle: Normal appearing right ventricular function. The right ventricle is a was within normal limits of size and there was normal appearing contractility of the right ventricular free wall.  5. Tricuspid valve: The tricuspid leaflets appeared structurally normal  and there was trace tricuspid insufficiency.   6. Inter-atrial septum: Interatrial septum was intact without evidence of patent foramen ovale or atrial septal defect by color Doppler or bubble study.  7. Left atrium: There was no thrombus noted in the left atrium or left atrial appendage.  8. Ascending aorta.: There was moderate thickening of the aortic walls. There was a well-defined aortic root and sinotubular ridge without effacement of the sinuses of Valsalva. There was no dilatation of the ascending aorta.   9. Descending aorta: There was mild atheromatous disease noted in the descending aorta. The diameter of the measured 2.53 cm.  Post-bypass Findings:  1. Aortic Valve: There was mild thickening of the aortic leaflets. The leaflets opened normally and there was no aortic insufficiency  2. Mitral valve: The mitral valve was unchanged from the pre-bypass study. There was trace mitral insufficiency with normal appearing leaflet coaptation.   3. Left ventricle: There was normal left ventricular systolic function. The ejection fraction was again estimated at 60%. There were  no regional wall motion abnormalities.   4. Right ventricle: There was normal appearing right ventricular function with good contractility the right ventricular free wall. This was unchanged from the pre-bypass study.  Kipp Brood M.D.

## 2012-03-11 NOTE — OR Nursing (Signed)
Leg incision at 0849; chest incision at 0857.

## 2012-03-11 NOTE — Progress Notes (Signed)
TCTS BRIEF SICU PROGRESS NOTE  Day of Surgery  S/P Procedure(s) (LRB): CORONARY ARTERY BYPASS GRAFTING (CABG) (N/A)   Just extubated Neuro grossly intact NSR BP stable C.I. 2.3 with PA pressures low UOP excellent Chest tube output low  Plan: Continue routine early postop  [name] [name] H 03/11/2012 5:55 PM

## 2012-03-12 ENCOUNTER — Inpatient Hospital Stay (HOSPITAL_COMMUNITY): Payer: Medicare Other

## 2012-03-12 LAB — GLUCOSE, CAPILLARY
Glucose-Capillary: 98 mg/dL (ref 70–99)
Glucose-Capillary: 101 mg/dL — ABNORMAL HIGH (ref 70–99)
Glucose-Capillary: 117 mg/dL — ABNORMAL HIGH (ref 70–99)
Glucose-Capillary: 101 mg/dL — ABNORMAL HIGH (ref 70–99)
Glucose-Capillary: 107 mg/dL — ABNORMAL HIGH (ref 70–99)
Glucose-Capillary: 107 mg/dL — ABNORMAL HIGH (ref 70–99)
Glucose-Capillary: 170 mg/dL — ABNORMAL HIGH (ref 70–99)
Glucose-Capillary: 208 mg/dL — ABNORMAL HIGH (ref 70–99)
Glucose-Capillary: 123 mg/dL — ABNORMAL HIGH (ref 70–99)

## 2012-03-12 LAB — PREPARE RBC (CROSSMATCH)

## 2012-03-12 LAB — BASIC METABOLIC PANEL
BUN: 13 mg/dL (ref 6–23)
CO2: 25 mEq/L (ref 19–32)
Chloride: 108 mEq/L (ref 96–112)
Glucose, Bld: 105 mg/dL — ABNORMAL HIGH (ref 70–99)
Potassium: 4.3 mEq/L (ref 3.5–5.1)

## 2012-03-12 LAB — CBC
HCT: 22.3 % — ABNORMAL LOW (ref 39.0–52.0)
Hemoglobin: 7.6 g/dL — ABNORMAL LOW (ref 13.0–17.0)
Hemoglobin: 9.4 g/dL — ABNORMAL LOW (ref 13.0–17.0)
MCH: 29.7 pg (ref 26.0–34.0)
MCHC: 33.8 g/dL (ref 30.0–36.0)
MCV: 88 fL (ref 78.0–100.0)
RBC: 2.53 MIL/uL — ABNORMAL LOW (ref 4.22–5.81)
RBC: 3.16 MIL/uL — ABNORMAL LOW (ref 4.22–5.81)
WBC: 9.7 10*3/uL (ref 4.0–10.5)

## 2012-03-12 LAB — POCT I-STAT, CHEM 8
BUN: 13 mg/dL (ref 6–23)
Calcium, Ion: 1.14 mmol/L (ref 1.13–1.30)
HCT: 28 % — ABNORMAL LOW (ref 39.0–52.0)
TCO2: 21 mmol/L (ref 0–100)

## 2012-03-12 MED ORDER — INSULIN GLARGINE 100 UNIT/ML ~~LOC~~ SOLN
20.0000 [IU] | Freq: Two times a day (BID) | SUBCUTANEOUS | Status: DC
Start: 2012-03-12 — End: 2012-03-14
  Administered 2012-03-12 – 2012-03-13 (×4): 20 [IU] via SUBCUTANEOUS

## 2012-03-12 MED ORDER — INSULIN ASPART 100 UNIT/ML ~~LOC~~ SOLN
0.0000 [IU] | SUBCUTANEOUS | Status: DC
  Administered 2012-03-12: 2 [IU] via SUBCUTANEOUS
  Administered 2012-03-12: 8 [IU] via SUBCUTANEOUS
  Administered 2012-03-12: 4 [IU] via SUBCUTANEOUS
  Administered 2012-03-13 (×2): 8 [IU] via SUBCUTANEOUS

## 2012-03-12 MED ORDER — TRAMADOL HCL 50 MG PO TABS
50.0000 mg | ORAL_TABLET | Freq: Four times a day (QID) | ORAL | Status: DC | PRN
  Administered 2012-03-13: 50 mg via ORAL
  Filled 2012-03-12: qty 1

## 2012-03-12 MED ORDER — MORPHINE SULFATE 2 MG/ML IJ SOLN
1.0000 mg | INTRAMUSCULAR | Status: DC | PRN
  Administered 2012-03-12: 1 mg via INTRAVENOUS
  Filled 2012-03-12: qty 1

## 2012-03-12 MED ORDER — FUROSEMIDE 10 MG/ML IJ SOLN
20.0000 mg | Freq: Once | INTRAMUSCULAR | Status: AC
  Administered 2012-03-12: 20 mg via INTRAVENOUS
  Filled 2012-03-12: qty 2

## 2012-03-12 NOTE — Progress Notes (Signed)
TCTS BRIEF SICU PROGRESS NOTE  1 Day Post-Op  S/P Procedure(s) (LRB): CORONARY ARTERY BYPASS GRAFTING (CABG) (N/A)   Stable day Ambulated in SICU  Plan: Continue routine care  Sammi Stolarz H 03/12/2012 7:18 PM

## 2012-03-12 NOTE — Progress Notes (Signed)
   CARDIOTHORACIC SURGERY PROGRESS NOTE   R1 Day Post-Op Procedure(s) (LRB): CORONARY ARTERY BYPASS GRAFTING (CABG) (N/A)  Subjective: Looks good.  Mild soreness in chest.  Objective: Vital signs: BP Readings from Last 1 Encounters:  03/12/12 105/51   Pulse Readings from Last 1 Encounters:  03/12/12 90   Resp Readings from Last 1 Encounters:  03/12/12 13   Temp Readings from Last 1 Encounters:  03/12/12 98.6 F (37 C)     Hemodynamics: PAP: (23-39)/(7-24) 32/15 mmHg CO:  [4 L/min-5.7 L/min] 4.5 L/min CI:  [1.8 L/min/m2-2.6 L/min/m2] 2 L/min/m2  Physical Exam:  Rhythm:   sinus  Breath sounds: clear  Heart sounds:  RRR  Incisions:  Dressings dry  Abdomen:  soft  Extremities:  warm   Intake/Output from previous day: 07/26 0701 - 07/27 0700 In: 6741.9 [I.V.:4773.9; Blood:588; NG/GT:30; IV Piggyback:1350] Out: 3990 [Urine:2970; Blood:550; Chest Tube:470] Intake/Output this shift: Total I/O In: 74.9 [I.V.:74.9] Out: 225 [Urine:225]  Lab Results:  [facility] 03/12/12 0356 03/11/12 1949 03/11/12 1945  WBC 9.7 -- 15.0*  HGB 7.6* 8.2* --  HCT 22.3* 24.0* --  PLT 130* -- 159   BMET:  Basename 03/12/12 0356 03/11/12 1949  NA 141 140  K 4.3 4.8  CL 108 110  CO2 25 --  GLUCOSE 105* 144*  BUN 13 12  CREATININE 1.28 1.30  CALCIUM 7.2* --    CBG (last 3)   Basename 03/12/12 0803 03/12/12 0656 03/12/12 0554  GLUCAP 107* 107* 101*   ABG    Component Value Date/Time   PHART 7.361 03/11/2012 1745   HCO3 20.9 03/11/2012 1745   TCO2 20 03/11/2012 1949   ACIDBASEDEF 4.0* 03/11/2012 1745   O2SAT 95.0 03/11/2012 1745   CXR: Mild bibasilar atelectasis L>R  Assessment/Plan: S/P Procedure(s) (LRB): CORONARY ARTERY BYPASS GRAFTING (CABG) (N/A)  Doing well POD1 Expected post op acute blood loss anemia, slightly worse Expected post op volume excess, mild Type II diabetes mellitus, excellent glycemic control but still on insulin drip Postop atelectasis,  mild Chronic renal insufficiency, preop   Transfuse 1 unit PRBC's  Mobilize  Diuresis  D/C tubes and lines  Add lantus insulin   Janziel Hockett H 03/12/2012 9:00 AM

## 2012-03-13 ENCOUNTER — Inpatient Hospital Stay (HOSPITAL_COMMUNITY): Payer: Medicare Other

## 2012-03-13 LAB — BASIC METABOLIC PANEL
Calcium: 7.6 mg/dL — ABNORMAL LOW (ref 8.4–10.5)
GFR calc non Af Amer: 46 mL/min — ABNORMAL LOW (ref >90)
Glucose, Bld: 91 mg/dL (ref 70–99)
Sodium: 138 mEq/L (ref 135–145)

## 2012-03-13 LAB — TYPE AND SCREEN
ABO/RH(D): A POS
Antibody Screen: NEGATIVE

## 2012-03-13 LAB — CBC
HCT: 24.3 % — ABNORMAL LOW (ref 39.0–52.0)
Hemoglobin: 8.4 g/dL — ABNORMAL LOW (ref 13.0–17.0)
MCH: 30.8 pg (ref 26.0–34.0)
MCHC: 34.6 g/dL (ref 30.0–36.0)
MCV: 89 fL (ref 78.0–100.0)
Platelets: 99 10*3/uL — ABNORMAL LOW (ref 150–400)
RBC: 2.73 MIL/uL — ABNORMAL LOW (ref 4.22–5.81)
RDW: 14.9 % (ref 11.5–15.5)
WBC: 9.1 10*3/uL (ref 4.0–10.5)

## 2012-03-13 LAB — GLUCOSE, CAPILLARY
Glucose-Capillary: 239 mg/dL — ABNORMAL HIGH (ref 70–99)
Glucose-Capillary: 87 mg/dL (ref 70–99)
Glucose-Capillary: 89 mg/dL (ref 70–99)

## 2012-03-13 MED ORDER — AMIODARONE LOAD VIA INFUSION
150.0000 mg | Freq: Once | INTRAVENOUS | Status: AC
  Administered 2012-03-13: 150 mg via INTRAVENOUS
  Filled 2012-03-13: qty 83.34

## 2012-03-13 MED ORDER — AMIODARONE HCL IN DEXTROSE 360-4.14 MG/200ML-% IV SOLN
0.5000 mg/min | INTRAVENOUS | Status: DC
  Administered 2012-03-14: 0.5 mg/min via INTRAVENOUS
  Filled 2012-03-13 (×3): qty 200

## 2012-03-13 MED ORDER — AMIODARONE HCL IN DEXTROSE 360-4.14 MG/200ML-% IV SOLN
1.0000 mg/min | INTRAVENOUS | Status: AC
  Administered 2012-03-13: 200 mL via INTRAVENOUS
  Administered 2012-03-13: 1 mg/min via INTRAVENOUS

## 2012-03-13 MED ORDER — AMIODARONE HCL IN DEXTROSE 360-4.14 MG/200ML-% IV SOLN
INTRAVENOUS | Status: AC
  Administered 2012-03-13: 200 mL via INTRAVENOUS
  Filled 2012-03-13: qty 400

## 2012-03-13 MED ORDER — POTASSIUM CHLORIDE 10 MEQ/50ML IV SOLN
INTRAVENOUS | Status: AC
  Filled 2012-03-13: qty 150

## 2012-03-13 MED ORDER — METOPROLOL TARTRATE 25 MG PO TABS
25.0000 mg | ORAL_TABLET | Freq: Two times a day (BID) | ORAL | Status: DC
  Administered 2012-03-13 – 2012-03-16 (×7): 25 mg via ORAL
  Filled 2012-03-13 (×8): qty 1

## 2012-03-13 MED ORDER — POTASSIUM CHLORIDE 10 MEQ/50ML IV SOLN
10.0000 meq | INTRAVENOUS | Status: AC
  Administered 2012-03-13 (×3): 10 meq via INTRAVENOUS

## 2012-03-13 NOTE — Progress Notes (Signed)
   CARDIOTHORACIC SURGERY PROGRESS NOTE   R2 Days Post-Op Procedure(s) (LRB): CORONARY ARTERY BYPASS GRAFTING (CABG) (N/A)  Subjective: Looks good and feels well, but just went into Afib this morning.  Asymptomatic.  Minimal pain. No SOB.   Objective: Vital signs: BP Readings from Last 1 Encounters:  03/13/12 114/71   Pulse Readings from Last 1 Encounters:  03/13/12 134   Resp Readings from Last 1 Encounters:  03/13/12 17   Temp Readings from Last 1 Encounters:  03/13/12 98.7 F (37.1 C) Oral    Hemodynamics: PAP: (30-35)/(10-14) 30/10 mmHg  Physical Exam:  Rhythm:   afib  Breath sounds: clear  Heart sounds:  irreg  Incisions:  Dressings dry  Abdomen:  soft  Extremities:  warm   Intake/Output from previous day: 07/27 0701 - 07/28 0700 In: 1634.9 [P.O.:480; I.V.:754.9; Blood:350; IV Piggyback:50] Out: 1615 [Urine:1615] Intake/Output this shift: Total I/O In: 130 [I.V.:120; Other:10] Out: 75 [Urine:75]  Lab Results:  [facility] 03/13/12 0405 03/12/12 1645  WBC 9.1 11.5*  HGB 8.4* 9.4*  HCT 24.3* 27.8*  PLT 99* 126*   BMET:  Basename 03/13/12 0405 03/12/12 1645 03/12/12 1644 03/12/12 0356  NA 138 -- 139 --  K 3.9 -- 4.3 --  CL 104 -- 102 --  CO2 26 -- -- 25  GLUCOSE 91 -- 187* --  BUN 15 -- 13 --  CREATININE 1.42* 1.43* -- --  CALCIUM 7.6* -- -- 7.2*    CBG (last 3)   Basename 03/13/12 0714 03/13/12 0427 03/13/12 0030  GLUCAP 112* 89 87   ABG    Component Value Date/Time   PHART 7.361 03/11/2012 1745   HCO3 20.9 03/11/2012 1745   TCO2 21 03/12/2012 1644   ACIDBASEDEF 4.0* 03/11/2012 1745   O2SAT 95.0 03/11/2012 1745   CXR: stable  Assessment/Plan: S/P Procedure(s) (LRB): CORONARY ARTERY BYPASS GRAFTING (CABG) (N/A)  Overall doing well POD1 Postop afib with rapid rate, asymptomatic Expected post op acute blood loss anemia, mild, improved post transfusion Expected post op volume excess, mild, diuresing some Type II diabetes mellitus,  excellent glycemic control   Start amiodarone  Mobilize  Continue lantus + SSI  Keep in SICU due to rhythm   Cadynce Garrette H 03/13/2012 9:59 AM

## 2012-03-13 NOTE — Progress Notes (Signed)
MEDICATION RELATED NOTE   Amiodarone Drug - Drug Interaction Consult Note  Amiodarone is metabolized by the cytochrome P450 system and therefore has the potential to cause many drug interactions. Amiodarone has an average plasma half-life of 50 days (range 20 to 100 days).   There is potential for drug interactions to occur several weeks or months after stopping treatment and the onset of drug interactions may be slow after initiating amiodarone.   [x]  Drugs that prolong the QT interval: Concurrent therapy is contraindicated due to the increased risk of torsades de pointes  Currently, the only interacting drug on this patients profile is Ondansetron.  See information below.  Interacting Medications/Orders:  Ondansetron Non-oral, Systemic QT Prolonging Agents 2 Oral or Non-Oral, Systemic  1. ondansetron Order ([id number]): ondansetron (ZOFRAN) injection 4 mg Route: Intravenous Start: 03/11/2012 1415 End: none Frequency: Every 6 hours PRN 1. amiodarone Order: amiodarone (PACERONE) tablet 200 mg Route: Oral Start: 03/13/2012 1500 End: none Frequency: Daily   Effects: Additive QT interval prolongation may occur during coadministration of ondansetron and amiodarone.  Mechanism: QT interval effects of each agent may be additive. Other mechanisms may exist.  Management: ECG monitoring is recommended for patients coprescribed amiodarone and ondansetron.  Recommendations: 1.  Continue ECG monitoring if co-administration of Ondansetron and Amiodarone is required.   Nadara Mustard, PharmD., MS Clinical Pharmacist Pager:  [phone]  Thank you for allowing pharmacy to be part of this patients care team. 03/13/2012 2:52 PM

## 2012-03-13 NOTE — Progress Notes (Signed)
TCTS BRIEF SICU PROGRESS NOTE  2 Days Post-Op  S/P Procedure(s) (LRB): CORONARY ARTERY BYPASS GRAFTING (CABG) (N/A)   Back in NSR since noon Overall excellent day  Plan: Continue current plan  [name],[name] 03/13/2012 5:43 PM

## 2012-03-14 ENCOUNTER — Encounter (HOSPITAL_COMMUNITY): Payer: Self-pay | Admitting: Thoracic Surgery (Cardiothoracic Vascular Surgery)

## 2012-03-14 ENCOUNTER — Inpatient Hospital Stay (HOSPITAL_COMMUNITY): Payer: Medicare Other

## 2012-03-14 LAB — BASIC METABOLIC PANEL
CO2: 27 mEq/L (ref 19–32)
GFR calc non Af Amer: 45 mL/min — ABNORMAL LOW (ref >90)
Glucose, Bld: 138 mg/dL — ABNORMAL HIGH (ref 70–99)
Potassium: 4.2 mEq/L (ref 3.5–5.1)
Sodium: 137 mEq/L (ref 135–145)

## 2012-03-14 LAB — GLUCOSE, CAPILLARY
Glucose-Capillary: 110 mg/dL — ABNORMAL HIGH (ref 70–99)
Glucose-Capillary: 143 mg/dL — ABNORMAL HIGH (ref 70–99)
Glucose-Capillary: 171 mg/dL — ABNORMAL HIGH (ref 70–99)
Glucose-Capillary: 95 mg/dL (ref 70–99)

## 2012-03-14 LAB — CBC
Hemoglobin: 8 g/dL — ABNORMAL LOW (ref 13.0–17.0)
MCH: 30.1 pg (ref 26.0–34.0)
Platelets: 113 10*3/uL — ABNORMAL LOW (ref 150–400)
RBC: 2.66 MIL/uL — ABNORMAL LOW (ref 4.22–5.81)
WBC: 9.2 10*3/uL (ref 4.0–10.5)

## 2012-03-14 MED ORDER — FUROSEMIDE 40 MG PO TABS
40.0000 mg | ORAL_TABLET | Freq: Every day | ORAL | Status: DC
  Administered 2012-03-14 – 2012-03-16 (×3): 40 mg via ORAL
  Filled 2012-03-14 (×3): qty 1

## 2012-03-14 MED ORDER — AMIODARONE HCL 200 MG PO TABS
400.0000 mg | ORAL_TABLET | Freq: Two times a day (BID) | ORAL | Status: DC
  Administered 2012-03-14 – 2012-03-16 (×5): 400 mg via ORAL
  Filled 2012-03-14 (×8): qty 2

## 2012-03-14 MED ORDER — INSULIN GLARGINE 100 UNIT/ML ~~LOC~~ SOLN
20.0000 [IU] | Freq: Every day | SUBCUTANEOUS | Status: DC
  Administered 2012-03-14: 20 [IU] via SUBCUTANEOUS

## 2012-03-14 MED ORDER — SODIUM CHLORIDE 0.9 % IJ SOLN
3.0000 mL | Freq: Two times a day (BID) | INTRAMUSCULAR | Status: DC
  Administered 2012-03-14 – 2012-03-15 (×4): 3 mL via INTRAVENOUS

## 2012-03-14 MED ORDER — SODIUM CHLORIDE 0.9 % IV SOLN: 250.0000 mL | INTRAVENOUS | Status: DC | PRN

## 2012-03-14 MED ORDER — MOVING RIGHT ALONG BOOK
Freq: Once | Status: AC
  Administered 2012-03-14: 10:00:00
  Filled 2012-03-14: qty 1

## 2012-03-14 MED ORDER — POTASSIUM CHLORIDE CRYS ER 20 MEQ PO TBCR
20.0000 meq | EXTENDED_RELEASE_TABLET | Freq: Every day | ORAL | Status: DC
  Administered 2012-03-14 – 2012-03-16 (×2): 20 meq via ORAL
  Filled 2012-03-14: qty 1
  Filled 2012-03-14: qty 2
  Filled 2012-03-14: qty 1

## 2012-03-14 MED ORDER — SODIUM CHLORIDE 0.9 % IJ SOLN: 3.0000 mL | INTRAMUSCULAR | Status: DC | PRN

## 2012-03-14 MED ORDER — GLIPIZIDE ER 10 MG PO TB24
10.0000 mg | ORAL_TABLET | Freq: Every day | ORAL | Status: DC
  Administered 2012-03-14 – 2012-03-16 (×3): 10 mg via ORAL
  Filled 2012-03-14 (×3): qty 1

## 2012-03-14 MED ORDER — INSULIN ASPART 100 UNIT/ML ~~LOC~~ SOLN
0.0000 [IU] | Freq: Three times a day (TID) | SUBCUTANEOUS | Status: DC
  Administered 2012-03-14: 2 [IU] via SUBCUTANEOUS
  Administered 2012-03-14: 4 [IU] via SUBCUTANEOUS
  Administered 2012-03-15 (×3): 2 [IU] via SUBCUTANEOUS

## 2012-03-14 MED FILL — Potassium Chloride Inj 2 mEq/ML: INTRAVENOUS | Qty: 40 | Status: AC

## 2012-03-14 MED FILL — Magnesium Sulfate Inj 50%: INTRAMUSCULAR | Qty: 10 | Status: AC

## 2012-03-14 MED FILL — Dexmedetomidine HCl IV Soln 200 MCG/2ML: INTRAVENOUS | Qty: 2 | Status: AC

## 2012-03-14 NOTE — Clinical Documentation Improvement (Signed)
CKD DOCUMENTATION CLARIFICATION QUERY   THIS DOCUMENT IS NOT A PERMANENT PART OF THE MEDICAL RECORD  Please update your documentation within the medical record to reflect your response to this query.                                                                                         03/14/12   Dr. Cornelius Moras and/or Associates,  In a better effort to capture your patient's severity of illness, reflect appropriate length of stay and utilization of resources, a review of the patient medical record has revealed the following indicators:  "Chronic renal insufficiency, preop Type II diabetes mellitus" [name] [name]  03/12/2012  9:00 AM     Progress Note  GFR this admission  (wm) 52, 46, 45    Based on your clinical judgment, please document in the progress notes and discharge summary if a condition below provides greater specificity regarding the patient's renal function:   - CKD Stage I -  GFR > OR = 90  - CKD Stage II - GFR 60-89  - CKD Stage III - GFR 30-59  - CKD Stage IV - GFR 15-29  - CKD Stage V - GFR < 15  - ESRD (End Stage Renal Disease)  - Other Condition  - Unable to Clinically Determine    In responding to this query please exercise your independent judgment.    The fact that a query is asked, does not imply that any particular answer is desired or expected.   Reviewed:  no additional documentation provided  Thank You,  Jerral Ralph  RN BSN CCDS Certified Clinical Documentation Specialist: Cell   [phone]  Health Information Management [place]  TO RESPOND TO THE THIS QUERY, FOLLOW THE INSTRUCTIONS BELOW:  1. If needed, update documentation for the patient's encounter via the notes activity.  2. Access this query again and click edit on the In [employer].  3. After updating, or not, click F2 to complete all highlighted (required) fields concerning your review. Select "additional documentation in the medical record" OR "no additional  documentation provided".  4. Click Sign note button.  5. The deficiency will fall out of your In Basket *Please let us know if you are not able to complete this workflow by phone or e-mail (listed below).

## 2012-03-14 NOTE — Progress Notes (Addendum)
   CARDIOTHORACIC SURGERY PROGRESS NOTE   R3 Days Post-Op Procedure(s) (LRB): CORONARY ARTERY BYPASS GRAFTING (CABG) (N/A)  Subjective: No complaints.  Minimal pain.  Eating well. Denies SOB  Objective: Vital signs: BP Readings from Last 1 Encounters:  03/14/12 98/81   Pulse Readings from Last 1 Encounters:  03/14/12 97   Resp Readings from Last 1 Encounters:  03/14/12 20   Temp Readings from Last 1 Encounters:  03/14/12 98.3 F (36.8 C) Oral    Hemodynamics:    Physical Exam:  Rhythm:   sinus  Breath sounds: clear  Heart sounds:  RRR  Incisions:  Dressings dry  Abdomen:  soft  Extremities:  warm   Intake/Output from previous day: 07/28 0701 - 07/29 0700 In: 2261.8 [P.O.:1080; I.V.:1131.8; IV Piggyback:50] Out: 3025 [Urine:3025] Intake/Output this shift: Total I/O In: 16.7 [I.V.:16.7] Out: -   Lab Results:  Basename 03/14/12 0400 03/13/12 0405  WBC 9.2 9.1  HGB 8.0* 8.4*  HCT 23.8* 24.3*  PLT 113* 99*   BMET:  Basename 03/14/12 0400 03/13/12 0405  NA 137 138  K 4.2 3.9  CL 102 104  CO2 27 26  GLUCOSE 138* 91  BUN 18 15  CREATININE 1.43* 1.42*  CALCIUM 7.8* 7.6*    CBG (last 3)   Basename 03/14/12 0719 03/14/12 0357 03/13/12 2349  GLUCAP 118* 94 95   ABG    Component Value Date/Time   PHART 7.361 03/11/2012 1745   HCO3 20.9 03/11/2012 1745   TCO2 21 03/12/2012 1644   ACIDBASEDEF 4.0* 03/11/2012 1745   O2SAT 95.0 03/11/2012 1745   CXR: Stable small left effusion and bibasilar atelectasis  Assessment/Plan: S/P Procedure(s) (LRB): CORONARY ARTERY BYPASS GRAFTING (CABG) (N/A)  Doing well POD3 Post op Afib, maintaining NSR on amiodarone Expected post op acute blood loss anemia, mild, stable Expected post op volume excess, mild, diuresing Type II diabetes mellitus, excellent glycemic control   Mobilize  Convert amiodarone to oral  Transfer step down  Restart Glipizide and decrease lantus dose    Khai Torbert  H 03/14/2012 8:33 AM

## 2012-03-14 NOTE — Addendum Note (Signed)
Addendum  created 03/14/12 0841 by Kipp Brood, MD   Modules edited:Notes Section

## 2012-03-14 NOTE — Progress Notes (Signed)
Anesthesiology Follow-up:  Alert sitting in chair, neuro intact, hemodynamically stable, ambulating with assistance. Renal function at baseline.  VS: T-.36.7 BP-112/65 HR-75 RR-10 O2 Sat 96%  On RA  Na-137 K-4.2 BUN/Cr. 18/1.42  Glucose: 142    H/H: 8.0/23.8 plts 113,000 WBC 9,200  Extubated 4 hours post-op. Stable post-op course, transfer to 2000 today.  Kipp Brood, MD

## 2012-03-14 NOTE — Care Management Note (Addendum)
    Page 1 of 1   03/16/2012     1:53:02 PM   CARE MANAGEMENT NOTE 03/16/2012  Patient:  [name] [name], [name] [name]   Account Number:  [id number]  Date Initiated:  03/14/2012  Documentation initiated by:  Junius Creamer  Subjective/Objective Assessment:   cabg     Action/Plan:   lives w wife, pcp dr Hurman Horn mand   Anticipated DC Date:  03/16/2012   Anticipated DC Plan:  HOME/SELF CARE      DC Planning Services  CM consult      Choice offered to / List presented to:             Status of service:  Completed, signed off Medicare Important Message given?   (If response is "NO", the following Medicare IM given date fields will be blank) Date Medicare IM given:   Date Additional Medicare IM given:    Discharge Disposition:  HOME/SELF CARE  Per UR Regulation:  Reviewed for med. necessity/level of care/duration of stay  If discussed at Long Length of Stay Meetings, dates discussed:    Comments:  7/29 8:35a debbie dowell rn,bsn

## 2012-03-14 NOTE — Plan of Care (Signed)
Problem: Phase III Progression Outcomes Goal: Time patient transferred to PCTU/Telemetry POD Outcome: Completed/Met Date Met:  03/14/12 Ambulated 350 feet prior to transfer; arrived safely to 2017 at 1435pm. VS stable prior and during the ambulation and the transfer. No personal belongings at 2303 that could be taken up to patient's new room. Pt settled in chair at 2017. Pt spouse and sister at bedside. Report given to receiving RN, Inetta Fermo. Tiny Chaudhary, Charity fundraiser.

## 2012-03-15 ENCOUNTER — Inpatient Hospital Stay (HOSPITAL_COMMUNITY): Payer: Medicare Other

## 2012-03-15 LAB — BASIC METABOLIC PANEL
CO2: 29 mEq/L (ref 19–32)
Calcium: 8.2 mg/dL — ABNORMAL LOW (ref 8.4–10.5)
Creatinine, Ser: 1.62 mg/dL — ABNORMAL HIGH (ref 0.50–1.35)
GFR calc non Af Amer: 39 mL/min — ABNORMAL LOW (ref >90)

## 2012-03-15 LAB — CBC
MCH: 30.7 pg (ref 26.0–34.0)
MCHC: 33.6 g/dL (ref 30.0–36.0)
MCV: 91.5 fL (ref 78.0–100.0)
Platelets: 198 10*3/uL (ref 150–400)
RBC: 2.83 MIL/uL — ABNORMAL LOW (ref 4.22–5.81)

## 2012-03-15 LAB — GLUCOSE, CAPILLARY
Glucose-Capillary: 135 mg/dL — ABNORMAL HIGH (ref 70–99)
Glucose-Capillary: 195 mg/dL — ABNORMAL HIGH (ref 70–99)

## 2012-03-15 MED ORDER — TRAMADOL HCL 50 MG PO TABS: 50.0000 mg | ORAL_TABLET | Freq: Four times a day (QID) | ORAL | Status: AC | PRN

## 2012-03-15 MED ORDER — ASPIRIN 325 MG PO TBEC: 325.0000 mg | DELAYED_RELEASE_TABLET | Freq: Every day | ORAL | Status: AC

## 2012-03-15 MED ORDER — AMIODARONE HCL 400 MG PO TABS: 400.0000 mg | ORAL_TABLET | Freq: Two times a day (BID) | ORAL | Status: DC

## 2012-03-15 MED ORDER — METOPROLOL TARTRATE 25 MG PO TABS: 25.0000 mg | ORAL_TABLET | Freq: Two times a day (BID) | ORAL | Status: DC

## 2012-03-15 MED ORDER — POTASSIUM CHLORIDE CRYS ER 20 MEQ PO TBCR
40.0000 meq | EXTENDED_RELEASE_TABLET | Freq: Once | ORAL | Status: AC
  Administered 2012-03-15: 40 meq via ORAL
  Filled 2012-03-15: qty 2

## 2012-03-15 MED ORDER — FUROSEMIDE 40 MG PO TABS: 40.0000 mg | ORAL_TABLET | Freq: Every day | ORAL | Status: DC

## 2012-03-15 MED ORDER — INSULIN GLARGINE 100 UNIT/ML ~~LOC~~ SOLN
15.0000 [IU] | Freq: Every day | SUBCUTANEOUS | Status: DC
  Administered 2012-03-15: 15 [IU] via SUBCUTANEOUS

## 2012-03-15 MED ORDER — POTASSIUM CHLORIDE CRYS ER 20 MEQ PO TBCR: 20.0000 meq | EXTENDED_RELEASE_TABLET | Freq: Every day | ORAL | Status: DC

## 2012-03-15 MED FILL — Electrolyte-R (PH 7.4) Solution: INTRAVENOUS | Qty: 3000 | Status: AC

## 2012-03-15 MED FILL — Mannitol IV Soln 20%: INTRAVENOUS | Qty: 500 | Status: AC

## 2012-03-15 MED FILL — Sodium Chloride Irrigation Soln 0.9%: Qty: 3000 | Status: AC

## 2012-03-15 MED FILL — Sodium Chloride IV Soln 0.9%: INTRAVENOUS | Qty: 1000 | Status: AC

## 2012-03-15 NOTE — Discharge Summary (Addendum)
Physician Discharge Summary  Patient ID: [name] [name] MRN: [id number] DOB/AGE: Jan 05, 1932 78 y.o.  Admit date: 03/11/2012 Discharge date: 03/16/2012  Admission Diagnoses:  Patient Active Problem List  Diagnosis  . TN (hypertension)  . Hyperlipidemia  . Sleep apnea  . Diabetes mellitus  . CAD (coronary artery disease)  . Gout   Discharge Diagnoses:   Patient Active Problem List  Diagnosis  . HTN (hypertension)  . Hyperlipidemia  . Sleep apnea  . Diabetes mellitus  . CAD (coronary artery disease)  . Gout  . S/P CABG x 4   Discharged Condition: good  History of Present Illness:    [name] [name] is a 78 yo male with history of hypertension, diabetes mellitus, and hyperlipidemia.  He has no known coronary disease.  The patient presented to his PCP with a 2 year complaint of progressively worsening symptoms most likely related to angina.  The patient experiences dull substernal chest pain with physical activity that is always relieved with rest.  The patient also experiences nausea and dyspnea during these exacerbations.  He underwent a nuclear medicine stress test that did not show any EKG changes, but there were perfusion images that were highly suspicious for stress-induced ischemia.  He was subsequently referred for cardiac catheterization which confirmed the presence of coronary artery disease with preserved LV function.  Due to these findings, the patient was referred to [facility]CTS for possible coronary bypass.  He was evaluated by Dr. Cornelius Moras on 02/26/2012 at which time it was felt the patient would benefit from coronary revascularization.  The risks and benefits of the procedure were explained to the patient and he was agreeable to proceed with surgery.    Hospital Course:   On 03/11/2012 the patient presented to [facility].  He was taken to the operating room and underwent CABG x utilizing LIMA to LAD, SVG to distal LAD, SVG to OM, and SVG to PDA.  The patient also underwent  Endoscopic Saphenous Vein Harvest of the right leg.  The patient tolerated the procedure well and was taken to the SICU in stable condition.  POD #0 the patient was extubated.  POD #1 patient was transfused 1 unit of packed red blood cells for acute post operative blood loss anemia.  His chest tubes and arterial lines were removed without difficulty.  He remained on insulin drip for glycemic control.  POD #2 patient developed atrial fibrillation and was treated with Amiodarone with successful conversion back to sinus rhythm.  He was weaned off his insulin drip.  Follow up chest xray did not reveal any evidence of pneumothorax.  POD #3 the patient was maintaining sinus rhythm he was transitioned to an oral regimen of Amiodarone.  He was restarted on his home Glipizide for glucose control.  He was medically stable and transferred to the step down unit.  POD #4 the patient again developed atrial fibrillation with immediate conversion to sinus rhythm.  The patients creatinine remains elevated and his home Metformin will be held at discharge.  The patient will need to have a repeat creatinine performed at his PCP in one week time.  If creatinine has improved can restart home Metformin.  He is medically stable at this time.  Should the patient maintain NSR with no further episodes of Atrial Fibrillation we will plan to discharge the patient in the next 24-48 hours.  The patient will need to follow up with Dr. Cornelius Moras in 3 weeks.  He will need to have a chest xray performed  at [facility] prior to his appointment.  He will need to schedule a 2 week follow up with his cardiologist Dr. Dwaine Deter    Significant Diagnostic Studies: cardiac catheterization  Cardiac catheterization performed 02/09/2012 at [facility] demonstrates severe three-vessel coronary artery disease with preserved left ventricular function. Specifically, there is diffuse heavy calcification all of the epicardial coronary  arteries proximally with 60% proximal left anterior descending coronary artery stenosis, 90% stenosis of the mid left anterior descending coronary artery just prior to the diagonal branch, and 90% stenosis of the distal left anterior descending coronary artery which is a large vessel that wraps the apex. The left circumflex system is relatively small. There is 60% ostial stenosis of left circumflex. There is diffuse disease with hazy 80-90% proximal right coronary artery stenosis her left ventricular systolic function is preserved with ejection fraction greater than 55%.  Treatments: surgery:   Coronary Artery Bypass Grafting x 4  Left Internal Mammary Artery to Mid Left Anterior Descending Coronary Artery  Saphenous Vein Graft to Distal Left Anterior Descending Coronary Artery  Saphenous Vein Graft to Posterior Descending Coronary Artery  Saphenous Vein Graft to Obtuse Marginal Branch of Left Circumflex Coronary Artery  Endoscopic Vein Harvest from Right Thigh and Lower Leg  Disposition: Home   Medication List  As of 03/16/2012  8:37 AM   STOP taking these medications         amLODipine 5 MG tablet      aspirin 81 MG tablet      losartan 100 MG tablet         TAKE these medications         amiodarone 400 MG tablet   Commonly known as: PACERONE   Take 1 tablet (400 mg total) by mouth 2 (two) times daily after a meal.      aspirin 325 MG EC tablet   Take 1 tablet (325 mg total) by mouth daily.      furosemide 40 MG tablet   Commonly known as: LASIX   Take 1 tablet (40 mg total) by mouth daily. For 5 days      glipiZIDE 10 MG 24 hr tablet   Commonly known as: GLUCOTROL XL   Take 10 mg by mouth daily.      metFORMIN 500 MG tablet   Commonly known as: GLUCOPHAGE   Take 500-1,000 mg by mouth 3 (three) times daily. Take 2 tab in the morning, 1 at lunch, and 1 in the evening      metoprolol tartrate 25 MG tablet   Commonly known as: LOPRESSOR   Take 1 tablet (25 mg total) by  mouth 2 (two) times daily.      multivitamin with minerals Tabs   Take 1 tablet by mouth every other day.      potassium chloride SA 20 MEQ tablet   Commonly known as: K-DUR,KLOR-CON   Take 1 tablet (20 mEq total) by mouth daily. For 5 days      traMADol 50 MG tablet   Commonly known as: ULTRAM   Take 1 tablet (50 mg total) by mouth every 6 (six) hours as needed.      zinc gluconate 50 MG tablet   Take 50 mg by mouth every other day.           Follow-up Information    Follow up with Purcell Nails, MD in 3 weeks. (Office will contact you with appointment)    Contact information:   [address] [place] [address] [phone]       Follow up with [facility] in 3 weeks. (Please get chest xray 1 hour prior to appointment with Dr. Cornelius Moras)       Follow up with Lamar Blinks, MD in 2 weeks. (Please contact office to make follow up appointment)    Contact information:   [address] [place] [address] [phone]          Signed: Lowella Dandy 03/16/2012, 8:37 AM

## 2012-03-15 NOTE — Progress Notes (Signed)
CARDIAC REHAB PHASE I   PRE:  Rate/Rhythm: 82SR  BP:  Supine:   Sitting: 131/57  Standing:    SaO2: 92%RA  MODE:  Ambulation: 550 ft   POST:  Rate/Rhythem: 109ST  BP:  Supine:   Sitting: 152/57  Standing:    SaO2: 98%RA 1015-1040 Pt walked 550 ft on RA with rolling walker and asst x 1. Gait steady. Tolerated well. Back to recliner after walk.  Duanne Limerick

## 2012-03-15 NOTE — Progress Notes (Signed)
                    [address]            [place] [address]          [phone]     4 Days Post-Op Procedure(s) (LRB): CORONARY ARTERY BYPASS GRAFTING (CABG) (N/A)  Subjective: Feels well, no complaints.    Objective: Vital signs in last 24 hours: Patient Vitals for the past 24 hrs:  BP Temp Temp src Pulse Resp SpO2 Weight  03/15/12 0426 116/59 mmHg 98.9 F (37.2 C) Oral 99  17  92 % -  03/15/12 0221 - - - - - - 227 lb 11.2 oz (103.284 kg)  03/14/12 2034 128/64 mmHg 98.8 F (37.1 C) Oral 82  16  93 % -  03/14/12 1500 113/49 mmHg 99.2 F (37.3 C) - - 16  96 % -  03/14/12 1400 109/54 mmHg - - 79  17  98 % -  03/14/12 1300 - - - - 15  98 % -  03/14/12 1200 123/56 mmHg - - 72  18  97 % -  03/14/12 1117 - 98.1 F (36.7 C) Oral - - - -  03/14/12 1100 115/62 mmHg - - 74  16  97 % -  03/14/12 1000 128/61 mmHg - - 82  15  96 % -   Current Weight  03/15/12 227 lb 11.2 oz (103.284 kg)  Pre-op wt= 97.5 kg   Intake/Output from previous day: 07/29 0701 - 07/30 0700 In: 695.4 [P.O.:600; I.V.:95.4] Out: 1225 [Urine:1225]  CBGs [phone]  PHYSICAL EXAM:  Heart: RRR Lungs: clear Wound: clean and dry Extremities: +LE edema    Lab Results: CBC: Basename 03/15/12 0530 03/14/12 0400  WBC 11.3* 9.2  HGB 8.7* 8.0*  HCT 25.9* 23.8*  PLT 198 113*   BMET:  Basename 03/15/12 0530 03/14/12 0400  NA 139 137  K 3.8 4.2  CL 103 102  CO2 29 27  GLUCOSE 86 138*  BUN 22 18  CREATININE 1.62* 1.43*  CALCIUM 8.2* 7.8*    PT/INR: No results found for this basename: LABPROT,INR in the last 72 hours  CXR: IMPRESSION:  1. Residual, but improved, left basilar airspace disease.  2. Small bilateral pleural effusions.   Assessment/Plan: S/P Procedure(s) (LRB): CORONARY ARTERY BYPASS GRAFTING (CABG) (N/A)  CV- episode rate controlled AF/SR with PACs early this am.  Now in SR.  Continue po Amiodarone, Lopressor.  Vol overload- diurese.  Chronic renal  insufficiency- Cr up slightly from 1.4 yesterday. Baseline on admission was 1.56.  Monitor.  Metformin on hold.  DM- sugars stable.  Back on po Glipizide, but Metformin held secondary to increased Cr.  Will decrease Lantus and monitor.  May need to adjust po meds for home.  CRPI, pulm toilet.  Possibly home 1-2 days if remains stable.   LOS: 4 days    [name] H 03/15/2012

## 2012-03-15 NOTE — Discharge Summary (Signed)
I agree with the above discharge summary and plan for follow-up.  [name],[name]  

## 2012-03-15 NOTE — Progress Notes (Addendum)
Pt went into A-fib this AM confirmed by EKG, Rate is controlled Pt is currently on Amiodarone 400 mg PO bid. Pt is  resting at this time, will continue to monitor.

## 2012-03-15 NOTE — Progress Notes (Signed)
EPW's removed per protocol.  INR 1.5...had to hold pressure 5 mins. Due to bleeding.  Gauze drsg applied with hypafix tape to provide some cont pressure.  Pt stayed on bedrest 1 hr.  No difficulty noted.  Will cont. To monitor.

## 2012-03-16 LAB — BASIC METABOLIC PANEL
BUN: 24 mg/dL — ABNORMAL HIGH (ref 6–23)
CO2: 29 mEq/L (ref 19–32)
Calcium: 8.5 mg/dL (ref 8.4–10.5)
Chloride: 105 mEq/L (ref 96–112)
Creatinine, Ser: 1.76 mg/dL — ABNORMAL HIGH (ref 0.50–1.35)
Glucose, Bld: 77 mg/dL (ref 70–99)

## 2012-03-16 NOTE — Progress Notes (Addendum)
5 Days Post-Op Procedure(s) (LRB): CORONARY ARTERY BYPASS GRAFTING (CABG) (N/A)  Subjective: [name] [name] has no complaints this morning.  He is hoping to be discharged home.    Objective: Vital signs in last 24 hours: Temp:  [98.7 F (37.1 C)-99 F (37.2 C)] 98.7 F (37.1 C) (07/31 0414) Pulse Rate:  [73-83] 73  (07/31 0414) Cardiac Rhythm:  [-] Normal sinus rhythm (07/30 1945) Resp:  [16-18] 18  (07/31 0414) BP: (110-142)/(47-78) 115/71 mmHg (07/31 0414) SpO2:  [94 %-97 %] 94 % (07/31 0414) Weight:  [224 lb 14.4 oz (102.014 kg)] 224 lb 14.4 oz (102.014 kg) (07/31 0414)   General appearance: alert, cooperative and no distress Neurologic: intact Heart: regular rate and rhythm Lungs: clear to auscultation bilaterally Abdomen: soft, non-tender; bowel sounds normal; no masses,  no organomegaly Extremities: edema trace Wound: clean and dry, ecchymosis of RLE  Lab Results:  Basename 03/15/12 0530 03/14/12 0400  WBC 11.3* 9.2  HGB 8.7* 8.0*  HCT 25.9* 23.8*  PLT 198 113*   BMET:  Basename 03/16/12 0545 03/15/12 0530  NA 142 139  K 4.0 3.8  CL 105 103  CO2 29 29  GLUCOSE 77 86  BUN 24* 22  CREATININE 1.76* 1.62*  CALCIUM 8.5 8.2*    PT/INR: No results found for this basename: LABPROT,INR in the last 72 hours ABG    Component Value Date/Time   PHART 7.361 03/11/2012 1745   HCO3 20.9 03/11/2012 1745   TCO2 21 03/12/2012 1644   ACIDBASEDEF 4.0* 03/11/2012 1745   O2SAT 95.0 03/11/2012 1745   CBG (last 3)   Basename 03/16/12 0612 03/15/12 2038 03/15/12 1615  GLUCAP 81 195* 150*    Assessment/Plan: S/P Procedure(s) (LRB): CORONARY ARTERY BYPASS GRAFTING (CABG) (N/A)  1. CV- brief episode of A. Fib very early yesterday morning, maintaining NSR ever since, on Lopressor, Amiodarone 2. Pulm- no acute issues, continue IS 3. Volume Overload- weight is up 5kg since admission on Lasix, however I/O have been negative for past few day 4. Hypokalemia- resolved 5. Acute Renal  Insufficiency- creatinine slowly trending up, will follow 6. DM- sugars controlled, on home Glipizide, currently holding Metformin due to elevated creatinine 7. Dispo- patient very anxious to be discharged, maintaining NSR, will discharge patient today, will hold Metformin due to elevation in creatinine at discharge,  Patient will need to follow up with PCP in 1 week to reassess creatinine and hopefully restart Metformin     LOS: 5 days    [name] [name], [name] [name] 03/16/2012   I have seen and examined the patient and agree with the assessment and plan as outlined.  [name] [name],[name] [name] 03/16/2012 8:57 AM

## 2012-03-16 NOTE — Discharge Summary (Signed)
I agree with the above discharge summary and plan for follow-up.  [name],[name] [name]  

## 2012-03-16 NOTE — Progress Notes (Signed)
3 sutures removed from chest tube site without difficulty. No s/s of infection noted. Painted with betadine. Midsternal incision also painted with betadine. Transportation called by this nurse for d/c.

## 2012-03-16 NOTE — Progress Notes (Signed)
[id number] Education completed with pt. Understanding voiced. Permission given to send referral letter to [facility].Noel Henandez DunlapRN

## 2012-04-06 ENCOUNTER — Other Ambulatory Visit: Payer: Self-pay | Admitting: Thoracic Surgery (Cardiothoracic Vascular Surgery)

## 2012-04-06 DIAGNOSIS — I2510 Atherosclerotic heart disease of native coronary artery without angina pectoris: Secondary | ICD-10-CM

## 2012-04-11 ENCOUNTER — Ambulatory Visit
Admission: RE | Admit: 2012-04-11 | Discharge: 2012-04-11 | Disposition: A | Discharge status: Home / Self Care | Payer: Medicare Other | Source: Ambulatory Visit | Attending: Thoracic Surgery (Cardiothoracic Vascular Surgery) | Admitting: Thoracic Surgery (Cardiothoracic Vascular Surgery)

## 2012-04-11 ENCOUNTER — Encounter: Payer: Self-pay | Admitting: Thoracic Surgery (Cardiothoracic Vascular Surgery)

## 2012-04-11 ENCOUNTER — Ambulatory Visit (INDEPENDENT_AMBULATORY_CARE_PROVIDER_SITE_OTHER): Payer: Self-pay | Admitting: Thoracic Surgery (Cardiothoracic Vascular Surgery)

## 2012-04-11 VITALS — BP 99/59 | HR 66 | Resp 18 | Ht 72.0 in | Wt 206.0 lb

## 2012-04-11 DIAGNOSIS — I2510 Atherosclerotic heart disease of native coronary artery without angina pectoris: Secondary | ICD-10-CM

## 2012-04-11 DIAGNOSIS — Z951 Presence of aortocoronary bypass graft: Secondary | ICD-10-CM

## 2012-04-11 NOTE — Progress Notes (Signed)
[address]            [name] [name] [address]          [phone]     CARDIOTHORACIC SURGERY OFFICE NOTE  Referring Provider is [name] Blinks, MD PCP is [name] Riggs, NP   HPI:  Patient returns for routine followup status post coronary artery bypass grafting x4 on 03/11/2012. His early postoperative recovery was notable for transient postoperative atrial fibrillation for which she was treated with amiodarone. He also developed mild acute renal insufficiency which resolved. At the time of hospital discharge his previous metformin was not resumed because of his underlying renal dysfunction. Since hospital discharge she has been seen in followup by [name] [name] on 2 occasions. His dose of amiodarone has been cut back to 200 mg daily and he has been continued on Lasix and potassium. The patient has overall done very well.  He has had minimal soreness in his chest and he is not using any type of pain relievers. His appetite is good and he is eating well. He denies any shortness of breath. He has not had any tachypalpitations or dizzy spells. She has not been sleeping well but for the last few nights his wife has given him a Xanax tablet and he has slept nicely. Overall both he and his wife are delighted with his progress. 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   Current Outpatient Prescriptions  Medication Sig Dispense Refill  . amiodarone (PACERONE) 400 MG tablet Take 1 tablet (400 mg total) by mouth 2 (two) times daily after a meal.  60 tablet  1  . aspirin 325 MG EC tablet Take 325 mg by mouth daily.      . furosemide (LASIX) 40 MG tablet Take 1 tablet (40 mg total) by mouth daily. For 5 days  5 tablet  0  . glipiZIDE (GLUCOTROL XL) 10 MG 24 hr tablet Take 10 mg by mouth daily.      Marland Kitchen losartan (COZAAR) 100 MG tablet Take 100 mg by mouth daily.       . metoprolol tartrate (LOPRESSOR) 25 MG tablet Take 1 tablet (25 mg total) by mouth 2 (two) times  daily.  60 tablet  1  . Multiple Vitamin (MULTIVITAMIN WITH MINERALS) TABS Take 1 tablet by mouth every other day.      . potassium chloride SA (K-DUR,KLOR-CON) 20 MEQ tablet Take 1 tablet (20 mEq total) by mouth daily. For 5 days  5 tablet  0  . zinc gluconate 50 MG tablet Take 50 mg by mouth every other day.          Physical Exam:   BP 99/59  Pulse 66  Resp 18  Ht 6' (1.829 m)  Wt 206 lb (93.441 kg)  BMI 27.94 kg/m2  SpO2 98%  General:  Well-appearing  Chest:   Clear to auscultation with symmetrical breath sounds  CV:   Regular rate and rhythm without murmur  Incisions:  Clean and dry and healing nicely  Abdomen:  Soft and nontender  Extremities:  Warm and well-perfused with no lower extremity edema  Diagnostic Tests:  *RADIOLOGY REPORT*   Clinical Data: Recent CABG.   CHEST - 2 VIEW   Comparison: 03/15/2012.   Findings: Trachea is midline.  Heart size normal.  Thoracic aorta is calcified.  Sternotomy wires are unchanged in position.  Minimal  residual left basilar atelectasis. Improving left pleural effusion, now small in size.  Resolved right pleural effusion.  No edema.  No pneumothorax.   IMPRESSION:   1.  Improving left basilar airspace disease and left pleural effusion, without complete resolution. 2.  Resolved right pleural effusion.     Original Report Authenticated By: Reyes Ivan, M.D.    Impression:  The patient is doing well 4 weeks status post coronary artery bypass grafting.   Plan:  I've instructed the patient to continue his amiodarone until his current prescription runs out. At that point I would probably stop it is he should be fairly low risk for recurrence of atrial fibrillation at this time.  I've encouraged patient to continue to gradually increase his physical activity as tolerated with his primary limitation at this point remaining that he refrain from heavy lifting or strenuous use of his arms or shoulder for at least another 2  months. I do think it is reasonable for him to go ahead and resume driving an automobile although I've cautioned him to start driving short distances only during the daytime at first. I've strongly encourage the patient to get started in the cardiac rehabilitation program. Have reminded both he and his wife that he will need to be is attention to long-term management of his diabetes, and if metformin is not to be resumed he may need further adjustment of his oral agents to get his diabetes under good control. All of his questions been addressed. In the future the patient will call and return to see [place] as needed.   Salvatore Decent. Cornelius Moras, MD 04/11/2012 11:26 AM

## 2012-04-11 NOTE — Patient Instructions (Signed)
The patient has been instructed that they may return driving an automobile as long as they are no longer requiring oral narcotic pain relievers during the daytime.  They have been advised to start driving short distances during the daylight and gradually increase from there as they feel comfortable. The patient has been reminded to continue to avoid any heavy lifting or strenuous use of arms or shoulders for at least a total of three months from the time of surgery. . 

## 2012-12-09 IMAGING — CR DG CHEST 1V PORT
1 series · 1 of 1 positions shown · non-contrast
Comparison: 03/07/2012

CLINICAL DATA: Postop

PORTABLE CHEST - 1 VIEW

[AP]
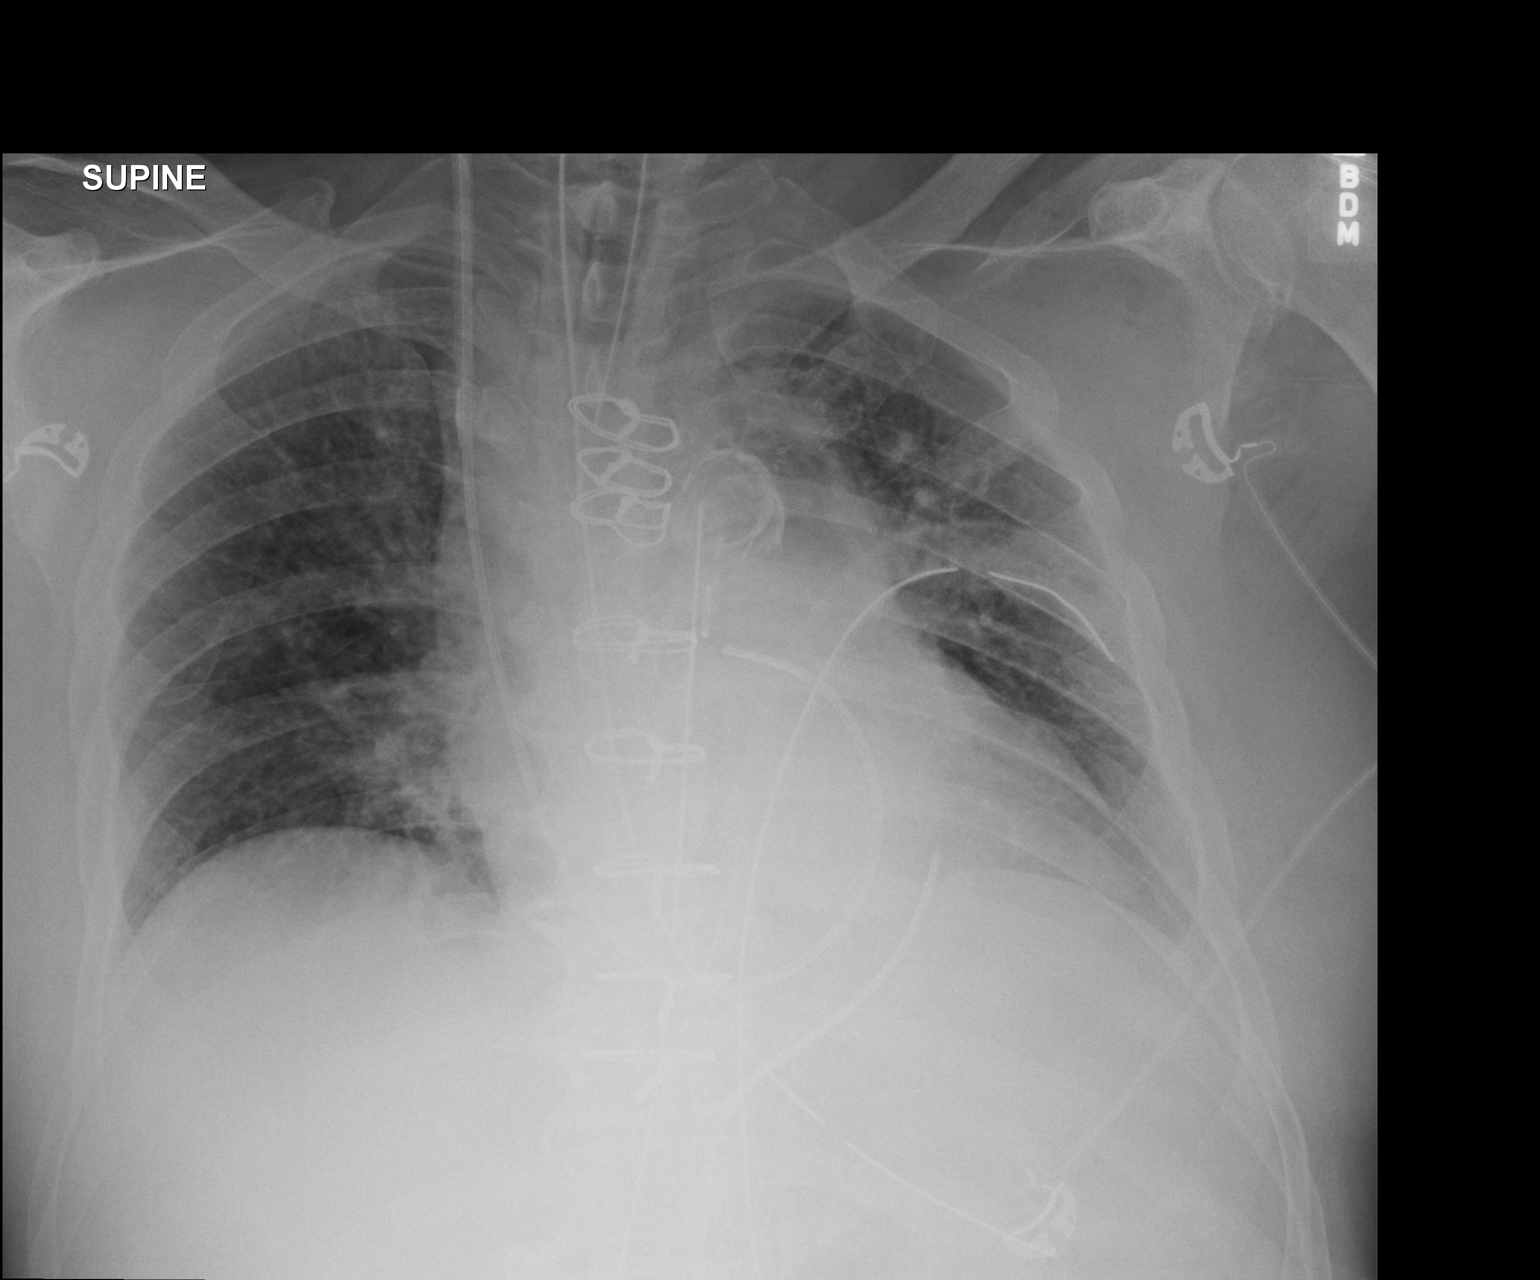

[1 of 1 positions shown; findings below may reference images not displayed]

FINDINGS: There is poor inspiration.  Borderline cardiomegaly.  The
patient is status post CABG.  There is  endotracheal tube in place
with tip 2.4 cm above the carina. NG tube in place is noted.  2
left chest tubes are noted.  There is probable lower mediastinal
drain.  There is right IJ Swan-Ganz catheter with tip in the region
of the left main pulmonary artery .Atherosclerotic calcifications
of the thoracic aorta again noted.  Mild left basilar atelectasis.
Probable small left pleural effusion.  No diagnostic pneumothorax.
No pulmonary edema.
IMPRESSION: The patient is status post CABG.  There is  endotracheal tube in
place with tip 2.4 cm above the carina. NG tube in place is noted.
2 left chest tubes are noted.  There is probable lower mediastinal
drain.  There is right IJ Swan-Ganz catheter with tip in the region
of the left main pulmonary artery .Atherosclerotic calcifications
of the thoracic aorta again noted.  Mild left basilar atelectasis.
Probable small left pleural effusion.  No diagnostic pneumothorax.

## 2013-01-09 IMAGING — CR DG CHEST 2V
2 series · 2 of 2 positions shown · non-contrast
Comparison: 03/15/2012.

CLINICAL DATA: Recent CABG.

CHEST - 2 VIEW

[w chest pa]
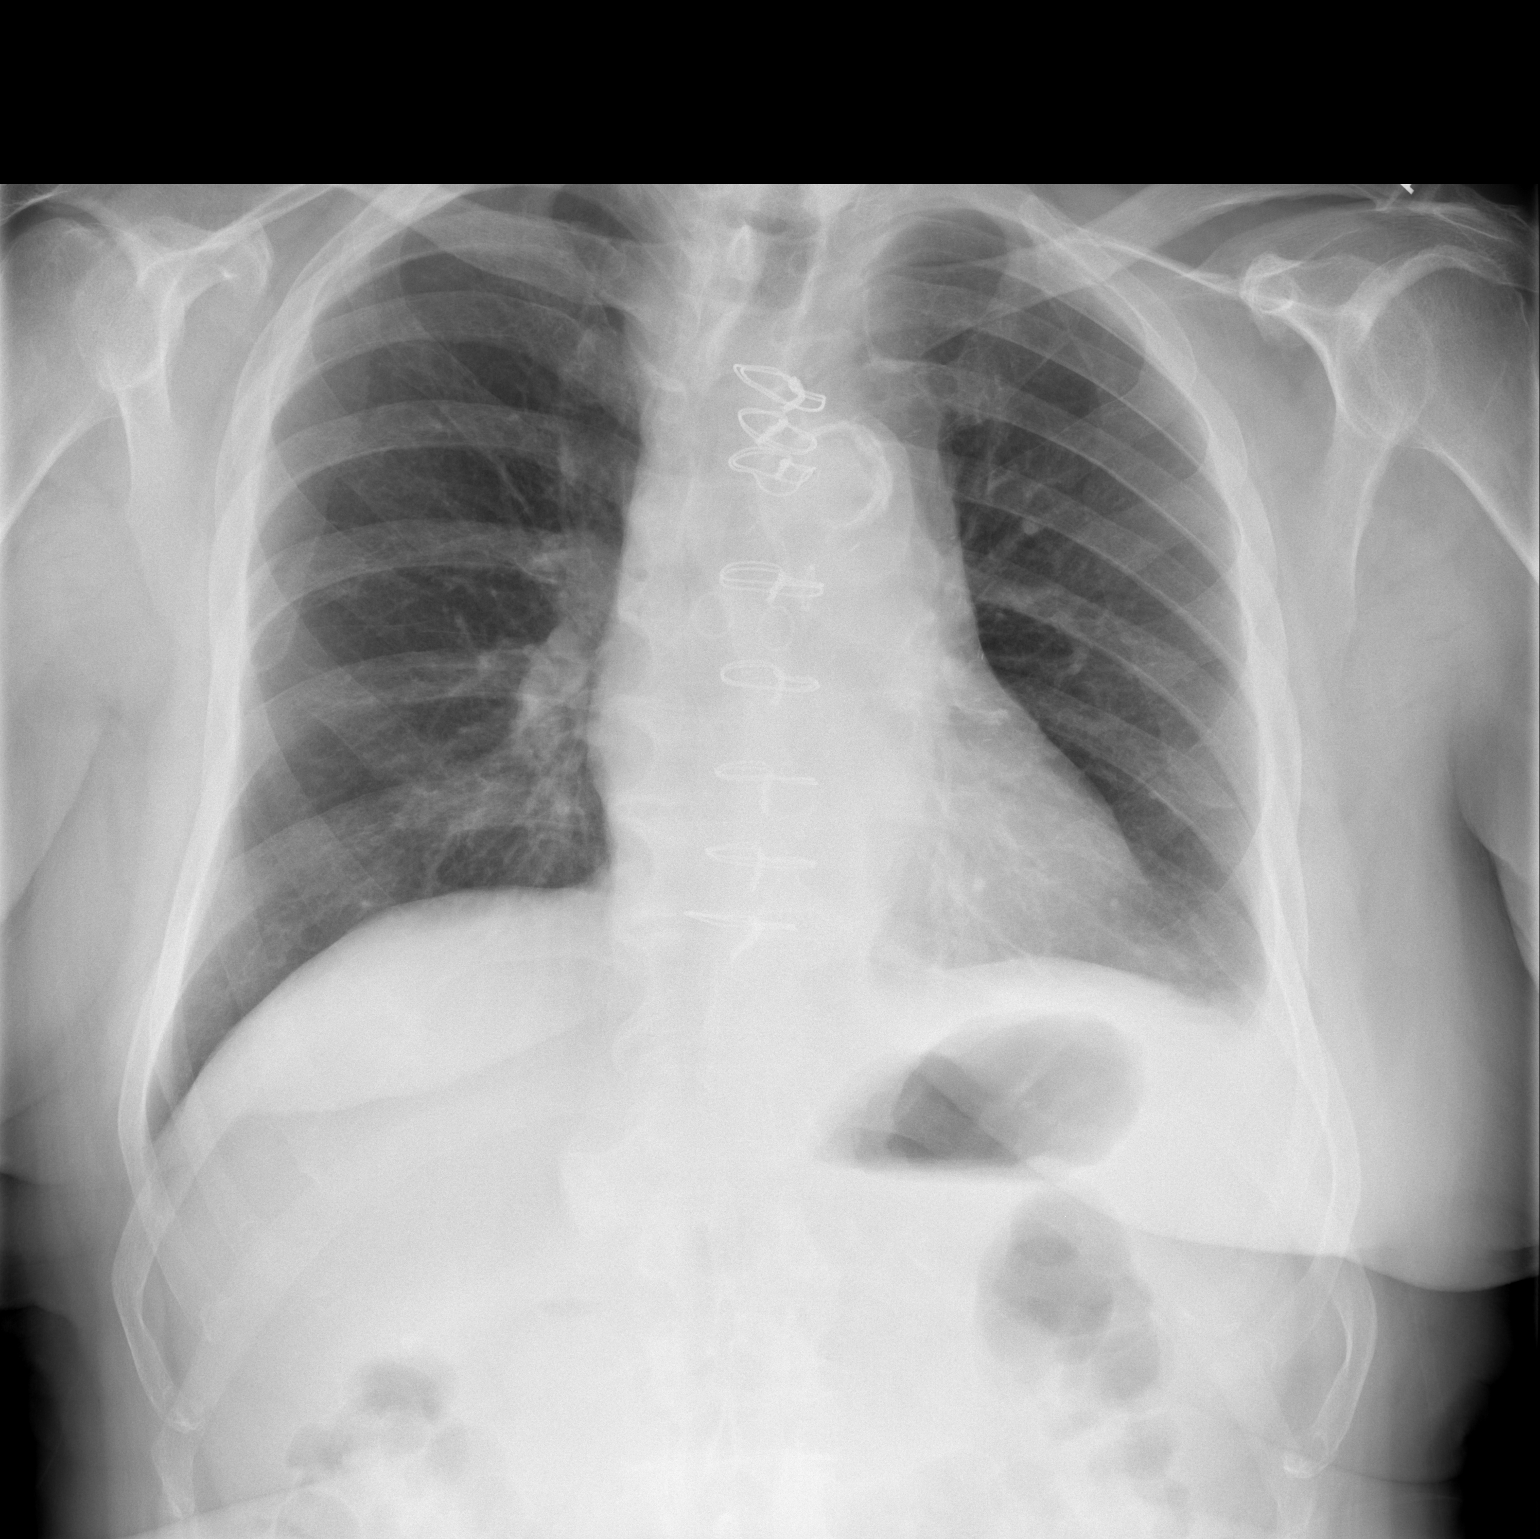

[w chest lat]
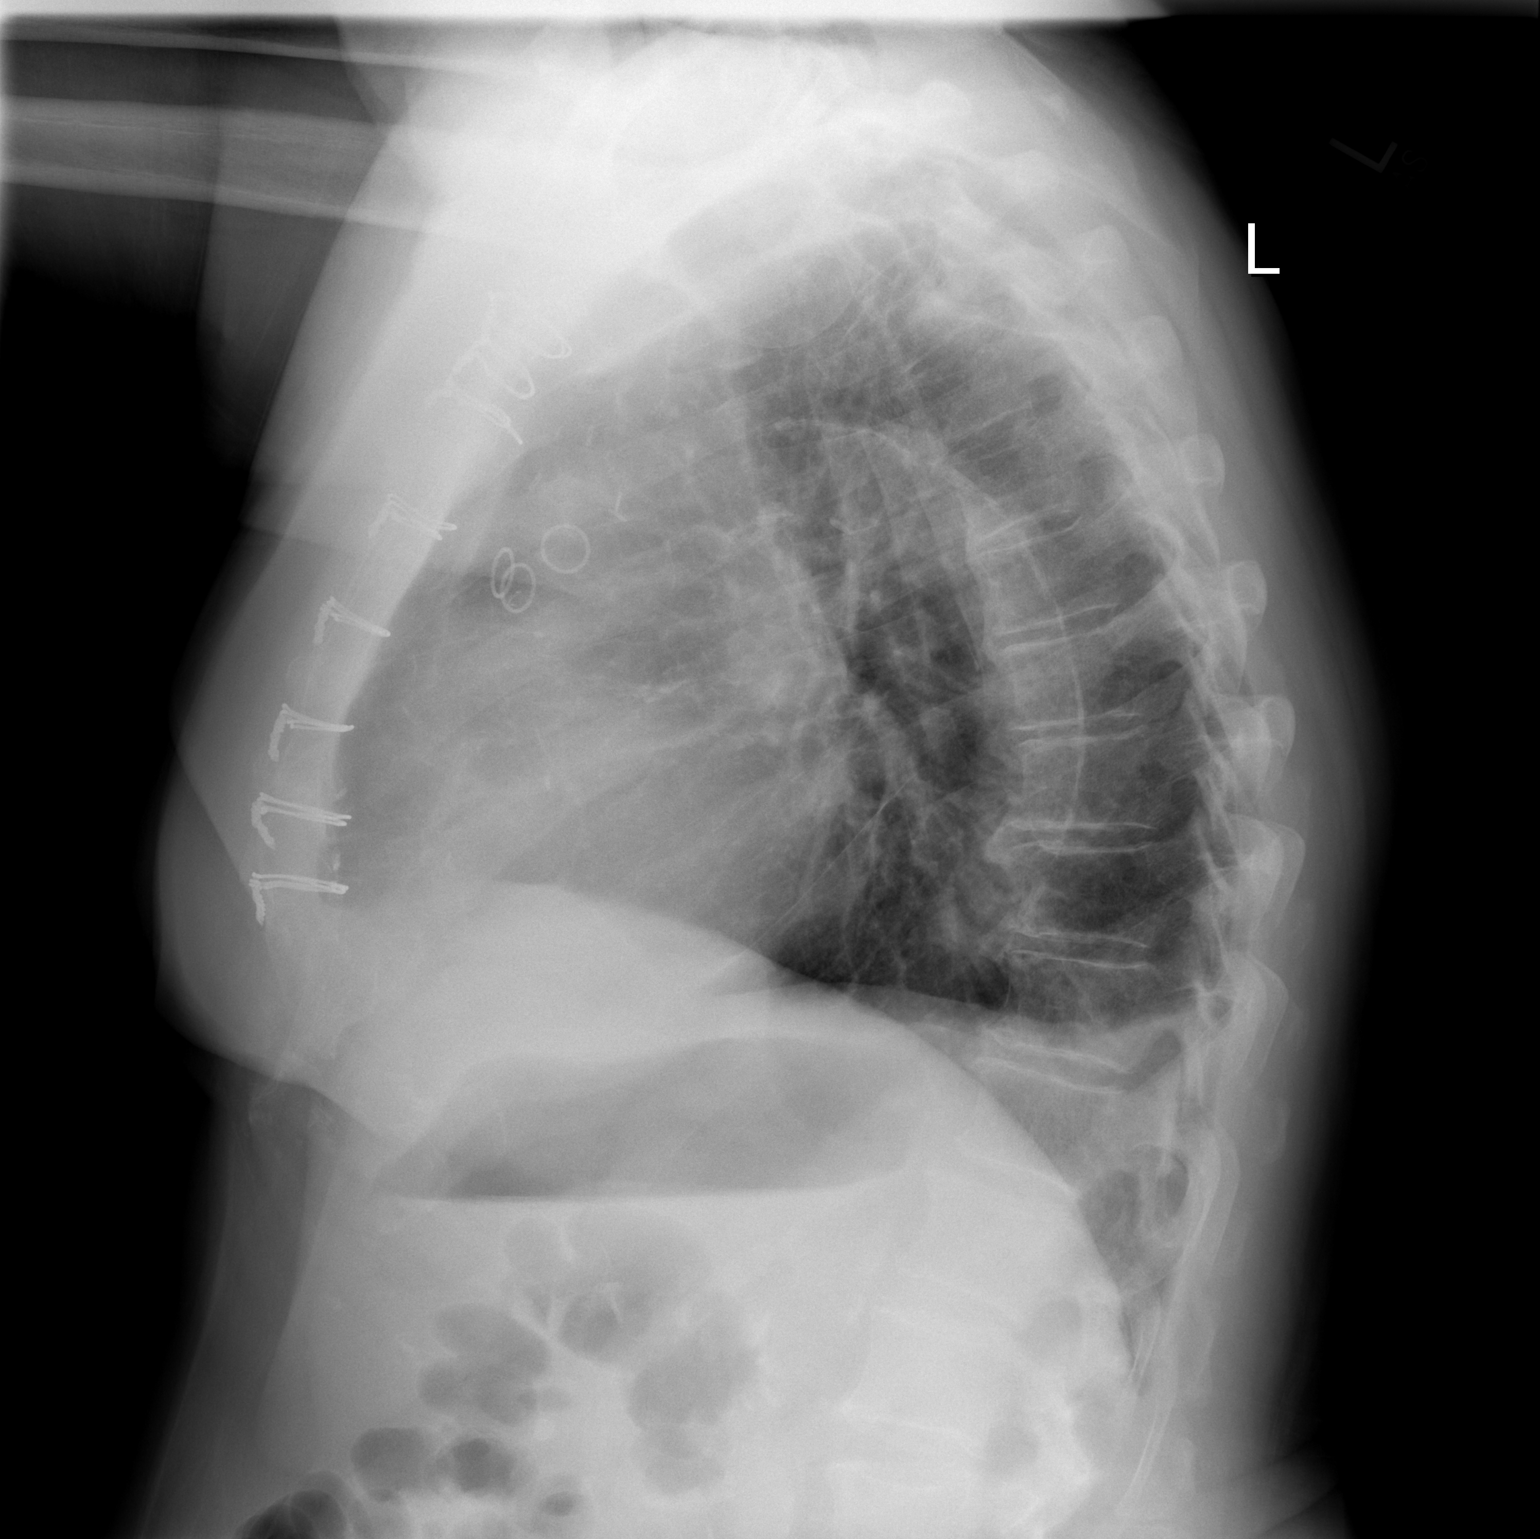

[2 of 2 positions shown; findings below may reference images not displayed]

FINDINGS: Trachea is midline.  Heart size normal.  Thoracic aorta
is calcified.  Sternotomy wires are unchanged in position.  Minimal
residual left basilar atelectasis. Improving left pleural effusion,
now small in size.  Resolved right pleural effusion.  No edema.  No
pneumothorax.
IMPRESSION: 1.  Improving left basilar airspace disease and left pleural
effusion, without complete resolution.
2.  Resolved right pleural effusion.

## 2014-02-26 ENCOUNTER — Ambulatory Visit: Payer: Self-pay | Admitting: Urology

## 2014-03-06 ENCOUNTER — Ambulatory Visit: Payer: Self-pay | Admitting: Urology

## 2014-03-09 LAB — PATHOLOGY REPORT

## 2014-05-03 ENCOUNTER — Ambulatory Visit: Payer: Medicare Other | Admitting: Cardiovascular Disease

## 2014-05-16 ENCOUNTER — Encounter: Payer: Self-pay | Admitting: Cardiovascular Disease

## 2014-05-16 ENCOUNTER — Ambulatory Visit (INDEPENDENT_AMBULATORY_CARE_PROVIDER_SITE_OTHER): Payer: Medicare PPO | Admitting: Cardiovascular Disease

## 2014-05-16 VITALS — BP 120/78 | HR 66 | Ht 72.0 in | Wt 211.5 lb

## 2014-05-16 DIAGNOSIS — I10 Essential (primary) hypertension: Secondary | ICD-10-CM

## 2014-05-16 DIAGNOSIS — E785 Hyperlipidemia, unspecified: Secondary | ICD-10-CM

## 2014-05-16 DIAGNOSIS — E1159 Type 2 diabetes mellitus with other circulatory complications: Secondary | ICD-10-CM

## 2014-05-16 DIAGNOSIS — Z951 Presence of aortocoronary bypass graft: Secondary | ICD-10-CM

## 2014-05-16 DIAGNOSIS — I2510 Atherosclerotic heart disease of native coronary artery without angina pectoris: Secondary | ICD-10-CM

## 2014-05-16 DIAGNOSIS — G4730 Sleep apnea, unspecified: Secondary | ICD-10-CM

## 2014-05-16 NOTE — Patient Instructions (Signed)
You are doing well.  Please start crestor on Monday and Thursday  Please call us if you have new issues that need to be addressed before your next appt.  Your physician wants you to follow-up in: 6 months.  You will receive a reminder letter in the mail two months in advance. If you don't receive a letter, please call our office to schedule the follow-up appointment.

## 2014-05-16 NOTE — Assessment & Plan Note (Signed)
He currently does not use CPAP

## 2014-05-16 NOTE — Progress Notes (Signed)
Patient ID: [name] [name], male    DOB: [birth year], 80 y.o.   MRN: [id number]  HPI Comments: Mr. [name] [name] is a pleasant 80 year old gentleman with history of coronary artery disease, bypass surgery in 2013 with 3 vessel bypass, diabetes with most recent hemoglobin A1c 8.3, hypertension, who presents to establish care in the [place] office He has a history of sleep apnea but does not use CPAP He had a LIMA to the LAD, vein graft to the distal LAD, PDA and OM  Cardiac catheterization performed 02/09/2012. This showed 40% ostial LAD disease, 60% followed by 90% proximal LAD disease, 90% distal LAD disease, 60 followed by 70% ostial and proximal left circumflex disease, 30% OM 2 disease, 80% proximal RCA disease  Reports that since his bypass surgery, he has been very active, denies any chest pain or shortness of breath with exertion. He works on his property, uses a chain saw, does lots of housework, farm work, yard work. Prior anginal symptoms resolved after his bypass surgery. No prior smoking history  Lab work from March 2015 shows hemoglobin A1c 8.3, creatinine 1.57  He was previously on a cholesterol pill, pravastatin 20 mg daily. He took himself off this as he was having muscle ache  EKG today shows normal sinus rhythm with rate 66 beats per minute with possible old anterior MI,   In terms of family history, father died of cancer at age 78, mother died of stroke [id number]   Outpatient Encounter Prescriptions as of 05/16/2014  Medication Sig  . aspirin 325 MG EC tablet Take 325 mg by mouth daily.  Marland Kitchen. glipiZIDE (GLUCOTROL XL) 10 MG 24 hr tablet Take 10 mg by mouth daily.  Marland Kitchen. losartan (COZAAR) 100 MG tablet Take 100 mg by mouth daily.   . metoprolol tartrate (LOPRESSOR) 25 MG tablet Take 1 tablet (25 mg total) by mouth 2 (two) times daily.  . Multiple Vitamin (MULTIVITAMIN WITH MINERALS) TABS Take 1 tablet by mouth every other day.  . zinc gluconate 50 MG tablet Take 50 mg by mouth every other  day.    Review of Systems  Constitutional: Negative.   HENT: Negative.   Eyes: Negative.   Respiratory: Negative.   Cardiovascular: Negative.   Gastrointestinal: Negative.   Endocrine: Negative.   Musculoskeletal: Negative.   Skin: Negative.   Allergic/Immunologic: Negative.   Neurological: Negative.   Hematological: Negative.   Psychiatric/Behavioral: Negative.   All other systems reviewed and are negative.   BP 120/78  Pulse 66  Ht 6' (1.829 m)  Wt 211 lb 8 oz (95.936 kg)  BMI 28.68 kg/m2  Physical Exam  Nursing note and vitals reviewed. Constitutional: He is oriented to person, place, and time. He appears well-developed and well-nourished.  HENT:  Head: Normocephalic.  Nose: Nose normal.  Mouth/Throat: Oropharynx is clear and moist.  Eyes: Conjunctivae are normal. Pupils are equal, round, and reactive to light.  Neck: Normal range of motion. Neck supple. No JVD present.  Cardiovascular: Normal rate, regular rhythm, S1 normal, S2 normal, normal heart sounds and intact distal pulses.  Exam reveals no gallop and no friction rub.   No murmur heard. Pulmonary/Chest: Effort normal and breath sounds normal. No respiratory distress. He has no wheezes. He has no rales. He exhibits no tenderness.  Abdominal: Soft. Bowel sounds are normal. He exhibits no distension. There is no tenderness.  Musculoskeletal: Normal range of motion. He exhibits no edema and no tenderness.  Lymphadenopathy:    He has no cervical adenopathy.  Neurological: He is alert and oriented to person, place, and time. Coordination normal.  Skin: Skin is warm and dry. No rash noted. No erythema.  Psychiatric: He has a normal mood and affect. His behavior is normal. Judgment and thought content normal.      Assessment and Plan

## 2014-05-16 NOTE — Assessment & Plan Note (Addendum)
Previous records were reviewed including old notes from [facility]. Doing well since his bypass surgery. Currently with no symptoms

## 2014-05-16 NOTE — Assessment & Plan Note (Signed)
Most recent cholesterol not available. He does have diabetes which places him at higher risk. We have suggested he take Crestor 5 mg Monday and Thursday given his history of myalgias on pravastatin in the past

## 2014-05-16 NOTE — Assessment & Plan Note (Signed)
Severe three-vessel coronary artery disease, bypass surgery in 2013. Currently with no symptoms of angina No medication changes made. No further testing ordered

## 2014-05-16 NOTE — Assessment & Plan Note (Signed)
Blood pressure is well controlled on today's visit. No changes made to the medications. 

## 2014-05-16 NOTE — Assessment & Plan Note (Signed)
We have encouraged continued exercise, careful diet management in an effort to lose weight. 

## 2014-08-29 ENCOUNTER — Ambulatory Visit: Payer: Self-pay | Admitting: Ophthalmology

## 2014-11-12 ENCOUNTER — Encounter: Payer: Self-pay | Admitting: Cardiovascular Disease

## 2014-11-12 ENCOUNTER — Ambulatory Visit (INDEPENDENT_AMBULATORY_CARE_PROVIDER_SITE_OTHER): Payer: Medicare Other | Admitting: Cardiovascular Disease

## 2014-11-12 VITALS — BP 130/68 | HR 60 | Ht 72.0 in | Wt 209.8 lb

## 2014-11-12 DIAGNOSIS — E785 Hyperlipidemia, unspecified: Secondary | ICD-10-CM

## 2014-11-12 DIAGNOSIS — I10 Essential (primary) hypertension: Secondary | ICD-10-CM | POA: Diagnosis not present

## 2014-11-12 DIAGNOSIS — G4730 Sleep apnea, unspecified: Secondary | ICD-10-CM

## 2014-11-12 DIAGNOSIS — I2510 Atherosclerotic heart disease of native coronary artery without angina pectoris: Secondary | ICD-10-CM

## 2014-11-12 DIAGNOSIS — Z951 Presence of aortocoronary bypass graft: Secondary | ICD-10-CM

## 2014-11-12 DIAGNOSIS — E1159 Type 2 diabetes mellitus with other circulatory complications: Secondary | ICD-10-CM | POA: Diagnosis not present

## 2014-11-12 NOTE — Progress Notes (Signed)
Patient ID: [name] [name], male    DOB: [birth year], 81 y.o.   MRN: [id number]  HPI Comments: [name] [name] is a pleasant 81 year old gentleman with history of coronary artery disease, bypass surgery in 2013 with 3 vessel bypass, diabetes with most recent hemoglobin A1c 8.3, hypertension, who presents for routine follow-up of his coronary artery disease  He has a history of sleep apnea but does not use CPAP He had a LIMA to the LAD, vein graft to the distal LAD, PDA and OM  Cardiac catheterization performed 02/09/2012. This showed 40% ostial LAD disease, 60% followed by 90% proximal LAD disease, 90% distal LAD disease, 60 followed by 70% ostial and proximal left circumflex disease, 30% OM 2 disease, 80% proximal RCA disease  In follow-up today, he reports that he continues to stay active. He denies any chest pain or shortness of breath with exertion. He works on his property, uses a chain saw, does lots of housework, farm work, yard work. He denies having any anginal symptoms with exertion  No prior smoking history He is tolerating Crestor 5 mg 2 days per week (He was previously on a cholesterol pill, pravastatin 20 mg daily. He took himself off this as he was having muscle ache)  EKG on today's visit shows normal sinus rhythm with poor R-wave progression through the anterior precordial leads  Lab work from March 2015 shows hemoglobin A1c 8.3, creatinine 1.57  father died of cancer at age 40, mother died of stroke 29   No Known Allergies  Outpatient Encounter Prescriptions as of 11/12/2014  Medication Sig  . aspirin 325 MG EC tablet Take 325 mg by mouth daily.  Marland Kitchen glipiZIDE (GLUCOTROL XL) 10 MG 24 hr tablet Take 10 mg by mouth daily.  Marland Kitchen losartan (COZAAR) 100 MG tablet Take 100 mg by mouth daily.   . metoprolol tartrate (LOPRESSOR) 25 MG tablet Take 1 tablet (25 mg total) by mouth 2 (two) times daily.  . Multiple Vitamin (MULTIVITAMIN WITH MINERALS) TABS Take 1 tablet by mouth every other  day.  . rosuvastatin (CRESTOR) 5 MG tablet Take one tablet on Tuesday and Thursday.  . zinc gluconate 50 MG tablet Take 50 mg by mouth every other day.    Past Medical History  Diagnosis Date  . HTN (hypertension)   . Hyperlipidemia   . Diabetes mellitus   . CAD (coronary artery disease)   . Gout   . Sleep apnea     Does not wear machine  . Shortness of breath     With ambulation  . 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 Appendectomy    . Colonoscopy    . Coronary artery bypass graft  03/11/2012    Procedure: CORONARY ARTERY BYPASS GRAFTING (CABG);  Surgeon: Purcell Nails, MD;  Location: [facility] OR;  Service: Open Heart Surgery;  Laterality: N/A;  . Bladder surgery    . Cardiac catheterization  2013    [facility]    Social History  reports that he has never smoked. He has never used smokeless tobacco. He reports that he drinks alcohol. He reports that he does not use illicit drugs.  Family History family history includes Cancer in his father; Stroke in his mother.   Review of Systems  Constitutional: Negative.   Respiratory: Negative.  Cardiovascular: Negative.   Gastrointestinal: Negative.   Musculoskeletal: Negative.   Skin: Negative.   Neurological: Negative.   Hematological: Negative.   Psychiatric/Behavioral: Negative.   All other systems reviewed and are negative.   BP 130/68 mmHg  Pulse 60  Ht 6' (1.829 m)  Wt 209 lb 12 oz (95.142 kg)  BMI 28.44 kg/m2  Physical Exam  Constitutional: He is oriented to person, place, and time. He appears well-developed and well-nourished.  HENT:  Head: Normocephalic.  Nose: Nose normal.  Mouth/Throat: Oropharynx is clear and moist.  Eyes: Conjunctivae are normal. Pupils are equal, round, and reactive to light.  Neck: Normal range of motion. Neck supple. No JVD present.  Cardiovascular: Normal  rate, regular rhythm, S1 normal, S2 normal, normal heart sounds and intact distal pulses.  Exam reveals no gallop and no friction rub.   No murmur heard. Pulmonary/Chest: Effort normal and breath sounds normal. No respiratory distress. He has no wheezes. He has no rales. He exhibits no tenderness.  Abdominal: Soft. Bowel sounds are normal. He exhibits no distension. There is no tenderness.  Musculoskeletal: Normal range of motion. He exhibits no edema or tenderness.  Lymphadenopathy:    He has no cervical adenopathy.  Neurological: He is alert and oriented to person, place, and time. Coordination normal.  Skin: Skin is warm and dry. No rash noted. No erythema.  Psychiatric: He has a normal mood and affect. His behavior is normal. Judgment and thought content normal.      Assessment and Plan   Nursing note and vitals reviewed.

## 2014-11-12 NOTE — Assessment & Plan Note (Signed)
We have encouraged continued exercise, careful diet management in an effort to lose weight. 

## 2014-11-12 NOTE — Assessment & Plan Note (Signed)
Currently does not use CPAP.

## 2014-11-12 NOTE — Assessment & Plan Note (Signed)
Blood pressure is well controlled on today's visit. No changes made to the medications. 

## 2014-11-12 NOTE — Assessment & Plan Note (Signed)
Currently with no symptoms of angina. No further workup at this time. Continue current medication regimen. 

## 2014-11-12 NOTE — Assessment & Plan Note (Signed)
Currently with no symptoms of angina. No medication changes made

## 2014-11-12 NOTE — Assessment & Plan Note (Signed)
We'll check his cholesterol through our officee reports having no labs done through primary care and no schedule visits anytime soon. Order has been placed

## 2014-11-12 NOTE — Patient Instructions (Signed)
You are doing well. No medication changes were made.  Please call us if you have new issues that need to be addressed before your next appt.  Your physician wants you to follow-up in: 12 months.  You will receive a reminder letter in the mail two months in advance. If you don't receive a letter, please call our office to schedule the follow-up appointment. 

## 2014-12-08 NOTE — Op Note (Signed)
PATIENT NAME:  [name] [name], [name] [name] MR#:  [id number] DATE OF BIRTH:  1931-11-07  DATE OF PROCEDURE:  03/06/2014  PRINCIPAL DIAGNOSIS: Bladder tumor.   POSTOPERATIVE DIAGNOSIS: Bladder tumor.   PROCEDURE: Cystoscopy, bladder biopsy, mitomycin bladder instillation.   SURGEON: Assunta GamblesBrian Ariely Riddell, M.D.   ANESTHESIA: Laryngeal mask airway anesthesia.   INDICATIONS: The patient is an 81 year old gentleman with a strong family history of bladder cancer. His wife desired baseline evaluation at the time of cystoscopy. He was noted to have an approximately 8-mm tumor on the right posterolateral wall consistent with early transitional cell carcinoma. He presents for bladder biopsy.   DESCRIPTION OF PROCEDURE: After informed consent was obtained, the patient was taken to the operating room and placed in the dorsal lithotomy position under laryngeal mask airway anesthesia. The patient was then prepped and draped in the usual standard fashion. The 22 French rigid cystoscope was introduced into the urethra under direct vision with no urethral abnormalities noted. Upon entering the prostatic fossa, prominent bilobar hypertrophy was noted with high-riding bladder neck. Complete visual obstruction was appreciated. Prominent hypervascularity was noted throughout the bladder neck and prostate region. Upon entering the bladder the mucosa was inspected in its entirety. Bilateral ureteral orifices were well visualized with no lesions noted. There was an approximately 8-mm papillary-appearing lesion on the posterolateral wall on the right. Visualization using narrow-band imaging was consistent with an early transitional cell carcinoma. There was a separate area just lateral, approximately 1.5-cm away, that approximately 6 mm in size, also with early papillary-appearing features. No other lesions were appreciated throughout the bladder.   Cold cup biopsy forceps were then utilized to remove each of the tumors in their entirety. The  bases and surrounding region were then extensively cauterized utilizing the Bugbee electrode. No significant bleeding was encountered. Several areas of bleeding from the hypervascularity within the prostate were cauterized. The cystoscope was removed. An 2018 French red rubber catheter was inserted to gravity drainage without difficulty. The remaining saline in the bladder was drained and 20 mg of mitomycin reconstituted in 50 mL of sterile water was instilled into the urinary bladder. Chemotherapy protocols were followed. The catheter was removed. The patient was returned to the supine position and awakened from laryngeal mask airway anesthesia. He was taken to the recovery room in stable condition. There were no problems or complications. The patient tolerated the procedure well.    ____________________________ Madolyn FriezeBrian S. Achilles Dunkope, MD bsc:lt D: 03/06/2014 09:59:11 ET T: 03/06/2014 12:35:52 ET JOB#: [id number]  cc: Madolyn FriezeBrian S. Achilles Dunkope, MD, <Dictator> Madolyn FriezeBRIAN S Tylan Kinn MD ELECTRONICALLY SIGNED 03/13/2014 7:19

## 2015-03-29 ENCOUNTER — Telehealth: Payer: Self-pay | Admitting: Cardiovascular Disease

## 2015-03-29 NOTE — Telephone Encounter (Signed)
Pt c/o medication issue:  1. Name of Medication: Crestor   2. How are you currently taking this medication (dosage and times per day)? 5 mg  Every other day  (bottle says daily epic rx says bi weekly)   3. Are you having a reaction (difficulty breathing--STAT)? rash all over  Cramps in legs adn hands   4. What is your medication issue? Is this reaction or interaction with other meds please call jenny to talk about it.

## 2015-03-29 NOTE — Telephone Encounter (Signed)
I spoke with Tinnie Gens (on DPR). She reports that the patient has been on Crestor twice weekly, but the directions on the last refill were for him to take this daily. The patient has only been taking it QOD for the last 2 weeks. He has always had a small amount of cramping, but this has gotten a little worse with taking this a little more frequently. He started with a rash about 3-4 days ago. Rash is "all over," but doesn't really itch per the patient. His last dose of Crestor was yesterday. I advised Tinnie Gens that increased frequency of the dose could cause more joint/ muscle aches, but I am unsure if this would just now start to cause a rash for him. I have advised that he hold his Crestor for the next 5-7 days to see if symptoms resolve and then call us back mid-end of next week to follow up. I advised the patient may use benadryl as needed.

## 2015-11-15 ENCOUNTER — Ambulatory Visit (INDEPENDENT_AMBULATORY_CARE_PROVIDER_SITE_OTHER): Payer: Medicare Other | Admitting: Cardiovascular Disease

## 2015-11-15 ENCOUNTER — Encounter: Payer: Self-pay | Admitting: Cardiovascular Disease

## 2015-11-15 VITALS — BP 120/62 | HR 62 | Ht 72.0 in | Wt 215.0 lb

## 2015-11-15 DIAGNOSIS — E785 Hyperlipidemia, unspecified: Secondary | ICD-10-CM | POA: Diagnosis not present

## 2015-11-15 DIAGNOSIS — Z951 Presence of aortocoronary bypass graft: Secondary | ICD-10-CM

## 2015-11-15 DIAGNOSIS — I2510 Atherosclerotic heart disease of native coronary artery without angina pectoris: Secondary | ICD-10-CM

## 2015-11-15 DIAGNOSIS — I10 Essential (primary) hypertension: Secondary | ICD-10-CM

## 2015-11-15 DIAGNOSIS — E1159 Type 2 diabetes mellitus with other circulatory complications: Secondary | ICD-10-CM

## 2015-11-15 NOTE — Assessment & Plan Note (Signed)
He does not want a statin  Secondary to myalgias  cholesterol is actually at goal without a statin

## 2015-11-15 NOTE — Assessment & Plan Note (Signed)
Currently with no symptoms of angina. No further workup at this time. Continue current medication regimen. 

## 2015-11-15 NOTE — Assessment & Plan Note (Signed)
Blood pressure is well controlled on today's visit. No changes made to the medications. 

## 2015-11-15 NOTE — Progress Notes (Signed)
Patient ID: [name] [name], male    DOB: 05-06-1932, 82 y.o.   MRN: [id number]  HPI Comments: [name] [name] is a pleasant 82 year old gentleman with history of coronary artery disease, bypass surgery in 2013 with 3 vessel bypass, diabetes with most recent hemoglobin A1c 9, hypertension, who presents for routine follow-up of his coronary artery disease  He has a history of sleep apnea but does not use CPAP He had a LIMA to the LAD, vein graft to the distal LAD, PDA and OM  In follow-up today, he is active, works on his back hoe, doing other jobs around the house No regular exercise program Reports that he stopped his Crestor last summer 2016, could not tolerate this secondary to cramping. Was only taking Crestor 5 mg 2 days per week Denies any symptoms concerning for angina. Lab work reviewed with him from March 2017 showing total cholesterol 138, LDL 60, hemoglobin A1c 9.4  Recently started on new diabetes medication which she feels is working  EKG on today's visit shows normal sinus rhythm with rate 64 bpm, left axis deviation, unable to exclude old anterior MI  Other past medical history reviewed Cardiac catheterization performed 02/09/2012. This showed 40% ostial LAD disease, 60% followed by 90% proximal LAD disease, 90% distal LAD disease, 60 followed by 70% ostial and proximal left circumflex disease, 30% OM 2 disease, 80% proximal RCA disease  Lab work from March 2015 shows hemoglobin A1c 8.3, creatinine 1.57  father died of cancer at age 80, mother died of stroke 6777   No Known Allergies  Outpatient Encounter Prescriptions as of 11/15/2015  Medication Sig  . Ascorbic Acid (VITAMIN C) 1000 MG tablet Take 1,000 mg by mouth daily.  Marland Kitchen. aspirin 325 MG EC tablet Take 325 mg by mouth daily.  . cholecalciferol (VITAMIN D) 1000 units tablet Take 1,000 Units by mouth daily.  . Cinnamon 500 MG capsule Take 1,000 mg by mouth daily.  Marland Kitchen. glipiZIDE (GLUCOTROL XL) 10 MG 24 hr tablet Take 10 mg by  mouth 2 (two) times daily.   Marland Kitchen. linagliptin (TRADJENTA) 5 MG TABS tablet Take 5 mg by mouth daily.  Marland Kitchen. losartan (COZAAR) 100 MG tablet Take 100 mg by mouth daily.   . metoprolol tartrate (LOPRESSOR) 25 MG tablet Take 1 tablet (25 mg total) by mouth 2 (two) times daily.  . Multiple Vitamin (MULTIVITAMIN WITH MINERALS) TABS Take 1 tablet by mouth every other day.  . zinc gluconate 50 MG tablet Take 50 mg by mouth every other day.  . [DISCONTINUED] rosuvastatin (CRESTOR) 5 MG tablet Reported on 11/15/2015   No facility-administered encounter medications on file as of 11/15/2015.    Past Medical History  Diagnosis Date  . HTN (hypertension)   . Hyperlipidemia   . Diabetes mellitus (HCC)   . CAD (coronary artery disease)   . Gout   . Sleep apnea     Does not wear machine  . Shortness of breath     With ambulation  . 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 Appendectomy    . Colonoscopy    . Coronary artery bypass graft  03/11/2012    Procedure: CORONARY ARTERY BYPASS GRAFTING (CABG);  Surgeon: Purcell Nailslarence H [name], MD;  [facility];  Service: Open Heart Surgery;  Laterality: N/A;  .  Bladder surgery    . Cardiac catheterization  2013    [facility]    Social History  reports that he has never smoked. He has never used smokeless tobacco. He reports that he drinks alcohol. He reports that he does not use illicit drugs.  Family History family history includes Cancer in his father; Stroke in his mother.   Review of Systems  Constitutional: Negative.   Respiratory: Negative.   Cardiovascular: Negative.   Gastrointestinal: Negative.   Musculoskeletal: Positive for myalgias.  Skin: Negative.   Neurological: Negative.   Hematological: Negative.   Psychiatric/Behavioral: Negative.   All other systems reviewed and are negative.   BP 120/62 mmHg  Pulse 62  Ht 6'  (1.829 m)  Wt 215 lb (97.523 kg)  BMI 29.15 kg/m2  Physical Exam  Constitutional: He is oriented to person, place, and time. He appears well-developed and well-nourished.  Obese  HENT:  Head: Normocephalic.  Nose: Nose normal.  Mouth/Throat: Oropharynx is clear and moist.  Eyes: Conjunctivae are normal. Pupils are equal, round, and reactive to light.  Neck: Normal range of motion. Neck supple. No JVD present.  Cardiovascular: Normal rate, regular rhythm, S1 normal, S2 normal, normal heart sounds and intact distal pulses.  Exam reveals no gallop and no friction rub.   No murmur heard. Pulmonary/Chest: Effort normal. No respiratory distress. He has decreased breath sounds. He has no wheezes. He has no rales. He exhibits no tenderness.  Abdominal: Soft. Bowel sounds are normal. He exhibits no distension. There is no tenderness.  Musculoskeletal: Normal range of motion. He exhibits no edema or tenderness.  Lymphadenopathy:    He has no cervical adenopathy.  Neurological: He is alert and oriented to person, place, and time. Coordination normal.  Skin: Skin is warm and dry. No rash noted. No erythema.  Psychiatric: He has a normal mood and affect. His behavior is normal. Judgment and thought content normal.      Assessment and Plan   Nursing note and vitals reviewed.

## 2015-11-15 NOTE — Assessment & Plan Note (Signed)
We have encouraged continued exercise, careful diet management in an effort to lose weight. Recommended he stop eating candy

## 2015-11-15 NOTE — Patient Instructions (Signed)
You are doing well. No medication changes were made.  Please call us if you have new issues that need to be addressed before your next appt.  Your physician wants you to follow-up in: 6 months.  You will receive a reminder letter in the mail two months in advance. If you don't receive a letter, please call our office to schedule the follow-up appointment.   

## 2016-09-04 ENCOUNTER — Ambulatory Visit (INDEPENDENT_AMBULATORY_CARE_PROVIDER_SITE_OTHER): Payer: Medicare Other | Admitting: Cardiovascular Disease

## 2016-09-04 ENCOUNTER — Encounter: Payer: Self-pay | Admitting: Cardiovascular Disease

## 2016-09-04 ENCOUNTER — Telehealth: Payer: Self-pay | Admitting: Cardiovascular Disease

## 2016-09-04 VITALS — BP 138/78 | HR 69 | Ht 72.0 in | Wt 200.0 lb

## 2016-09-04 DIAGNOSIS — Z951 Presence of aortocoronary bypass graft: Secondary | ICD-10-CM

## 2016-09-04 DIAGNOSIS — E782 Mixed hyperlipidemia: Secondary | ICD-10-CM | POA: Diagnosis not present

## 2016-09-04 DIAGNOSIS — E1159 Type 2 diabetes mellitus with other circulatory complications: Secondary | ICD-10-CM

## 2016-09-04 DIAGNOSIS — I10 Essential (primary) hypertension: Secondary | ICD-10-CM | POA: Diagnosis not present

## 2016-09-04 DIAGNOSIS — I2510 Atherosclerotic heart disease of native coronary artery without angina pectoris: Secondary | ICD-10-CM | POA: Diagnosis not present

## 2016-09-04 NOTE — Telephone Encounter (Signed)
Added to pt's med list

## 2016-09-04 NOTE — Progress Notes (Signed)
Cardiology Office Note  Date:  09/04/2016   ID:  [name] [name], DOB [birth year], MRN [id number]  PCP:  Nicholaus CorollaKhan, Farah, MD   Chief Complaint  Patient presents with  . other     OD 63mo f/u. Pt c/o chest pain at times. Reviewed meds with pt verbally.    HPI:  [name] [name] is a pleasant 83 year old gentleman with history of coronary artery disease, bypass surgery in 2013 with 3 vessel bypass, diabetes with most recent hemoglobin A1c 9, hypertension, who presents for routine follow-up of his coronary artery disease  He has a history of sleep apnea but does not use CPAP He had a LIMA to the LAD, vein graft to the distal LAD, PDA and OM  In follow-up today he reports that he has been eating well, more vegetables Weight down 15 pounds, Not smoking Shoveling snow yesterday, did not appreciate any chest pain Has not required any nitroglycerin  He is now followed by Dr. Johny ChessMoriarty for his diabetes  Lab work reviewed 10/2015 Total chol 138, LDL 60 No recent HBA1C available, previous hemoglobin A1c more than 10/24/2015  Sugars running much better, per [name] [name] 80s to 120  No regular exercise program Previously stopped Crestor on his own Did not want to restart a statin in follow-up  EKG on today's visit shows normal sinus rhythm with rate 69 bpm, left axis deviation, unable to exclude old anterior MI  Other past medical history reviewed Cardiac catheterization performed 02/09/2012. 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  PSH:    Past Surgical History:  Procedure Laterality Date  . APPENDECTOMY    . BLADDER SURGERY    . CARDIAC CATHETERIZATION  2013   [facility]  . COLONOSCOPY    . CORONARY ARTERY BYPASS GRAFT  03/11/2012   Procedure: CORONARY ARTERY BYPASS GRAFTING (CABG);  Surgeon: Purcell Nailslarence H [name], MD;  [facility];  Service: Open Heart Surgery;  Laterality: N/A;    Current Outpatient Prescriptions  Medication Sig Dispense Refill  . Ascorbic Acid (VITAMIN C) 1000 MG tablet Take 1,000 mg by mouth daily.    Marland Kitchen. aspirin 325 MG EC tablet Take 325 mg by mouth daily.    . cholecalciferol (VITAMIN D) 1000 units tablet Take 1,000 Units by mouth daily.    . Cinnamon 500 MG capsule Take 1,000 mg by mouth daily.    Marland Kitchen. glipiZIDE (GLUCOTROL XL) 10 MG 24 hr tablet Take 10 mg by mouth 2 (two) times daily.     Marland Kitchen. linagliptin (TRADJENTA) 5 MG TABS tablet Take 5 mg by mouth daily.    Marland Kitchen. losartan (COZAAR) 100 MG tablet Take 100 mg by mouth daily.     . metFORMIN (GLUCOPHAGE) 500 MG tablet Take 500 mg by mouth daily.    . Multiple Vitamin (MULTIVITAMIN WITH MINERALS) TABS Take 1 tablet by mouth every other day.    . zinc gluconate 50 MG tablet Take 50 mg by mouth every other day.    . metoprolol tartrate (LOPRESSOR)  25 MG tablet Take 1 tablet (25 mg total) by mouth 2 (two) times daily. 60 tablet 1  . pentoxifylline (TRENTAL) 400 MG CR tablet Take 400 mg by mouth 3 (three) times daily with meals.     No current facility-administered medications for this visit.      Allergies:   Patient has no known allergies.   Social History:  The patient  reports that he has never smoked. He has never used smokeless tobacco. He reports that he drinks alcohol. He reports that he does not use drugs.   Family History:   family history includes Cancer in his father; Stroke in his mother.    Review of Systems: Review of Systems  Constitutional: Negative.   Respiratory: Negative.   Cardiovascular: Negative.    Gastrointestinal: Negative.   Musculoskeletal: Negative.   Neurological: Negative.   Psychiatric/Behavioral: Negative.   All other systems reviewed and are negative.    PHYSICAL EXAM: VS:  BP 138/78 (BP Location: Left Arm, Patient Position: Sitting, Cuff Size: Normal)   Pulse 69   Ht 6' (1.829 m)   Wt 200 lb (90.7 kg)   BMI 27.12 kg/m  , BMI Body mass index is 27.12 kg/m. GEN: Well nourished, well developed, in no acute distress , obese HEENT: normal  Neck: no JVD, carotid bruits, or masses Cardiac: RRR; no murmurs, rubs, or gallops,no edema  Respiratory:  clear to auscultation bilaterally, normal work of breathing GI: soft, nontender, nondistended, + BS MS: no deformity or atrophy  Skin: warm and dry, no rash Neuro:  Strength and sensation are intact Psych: euthymic mood, full affect    Recent Labs: No results found for requested labs within last 8760 hours.    Lipid Panel No results found for: CHOL, HDL, LDLCALC, TRIG    Wt Readings from Last 3 Encounters:  09/04/16 200 lb (90.7 kg)  11/15/15 215 lb (97.5 kg)  11/12/14 209 lb 12 oz (95.1 kg)       ASSESSMENT AND PLAN:  Essential hypertension - Plan: EKG 12-Lead Blood pressure is well controlled on today's visit. No changes made to the medications.  Mixed hyperlipidemia Cholesterol is at goal on the current lipid regimen. No changes to the medications were made.  Coronary artery disease involving native coronary artery of native heart without angina pectoris Long discussion with him concerning his activities and any symptoms Reports being active though no regular exercise program, denies angina High risk of recurrent coronary disease given his diabetes Suggested if he has any recurrent chest pain symptoms that he call our office for further evaluation  Type 2 diabetes mellitus with other circulatory complication, without long-term current use of insulin (HCC) Followed by Dr. Johny Chess. Reports being on  some medication, stopped his cholesterol very low, had to stop the medication Recent weight loss likely help with his numbers  S/P CABG x 4 Previous surgical anatomy discussed with him No further testing at this time He will call the office if he develops symptoms   Total encounter time more than 25 minutes  Greater than 50% was spent in counseling and coordination of care with the patient   Disposition:   F/U  6 months   Orders Placed This Encounter  Procedures  . EKG 12-Lead     Signed, Dossie Arbour, M.D., Ph.D. 09/04/2016  [facility] [place], [place] [phone]

## 2016-09-04 NOTE — Patient Instructions (Signed)

## 2016-09-04 NOTE — Telephone Encounter (Signed)
PT CALLING BACK WITH MEDICATION DIABETIC DR PRESCRIBED. PENTOXIFYLLINE ER.  400 MG 2 A DAY

## 2017-03-04 NOTE — Progress Notes (Signed)
Cardiology Office Note  Date:  03/05/2017   ID:  [name] [name], DOB [birth year], MRN [id number]  PCP:  Nicholaus CorollaKhan, Farah, MD   Chief Complaint  Patient presents with  . other    6 month follow up. Patient c/o right arm and hand numbness. Meds reviewed verbally with patient.     HPI:  [name] [name] is a pleasant 83 year old gentleman with history of  coronary artery disease,  bypass surgery in 2013 with 3 vessel bypass,  diabetes with most recent hemoglobin A1c 6.9,  hypertension,  He has a history of sleep apnea but does not use CPAP He had a LIMA to the LAD, vein graft to the distal LAD, PDA and OM who presents for routine follow-up of his coronary artery disease   In follow-up, he continues to eat well, weight is down Overall feels well, denies shortness of breath Not smoking Has not required any nitroglycerin Reports his diabetes numbers are improving with weight loss  Total chol 138, LDL 60  No regular exercise program Previously stopped Crestor on his own Did not want to restart a statin in follow-up  EKG on today's visit shows normal sinus rhythm with rate 63 bpm, left axis deviation, unable to exclude old anterior MI  Other past medical history reviewed Cardiac catheterization performed 02/09/2012. 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  PSH:    Past Surgical History:  Procedure Laterality Date  . APPENDECTOMY    . BLADDER SURGERY    . CARDIAC CATHETERIZATION  2013   [facility]  .  COLONOSCOPY    . CORONARY ARTERY BYPASS GRAFT  03/11/2012   Procedure: CORONARY ARTERY BYPASS GRAFTING (CABG);  Surgeon: Purcell Nailslarence H [name], MD;  [facility];  Service: Open Heart Surgery;  Laterality: N/A;    Current Outpatient Prescriptions  Medication Sig Dispense Refill  . Ascorbic Acid (VITAMIN C) 1000 MG tablet Take 1,000 mg by mouth daily.    Marland Kitchen. aspirin 325 MG EC tablet Take 325 mg by mouth daily.    . cholecalciferol (VITAMIN D) 1000 units tablet Take 1,000 Units by mouth daily.    . Cinnamon 500 MG capsule Take 1,000 mg by mouth daily.    Marland Kitchen. glipiZIDE (GLUCOTROL XL) 10 MG 24 hr tablet Take 10 mg by mouth 2 (two) times daily.     Marland Kitchen. linagliptin (TRADJENTA) 5 MG TABS tablet Take 5 mg by mouth daily.    Marland Kitchen. losartan (COZAAR) 100 MG tablet Take 100 mg by mouth daily.     . metFORMIN (GLUCOPHAGE) 500 MG tablet Take 500 mg by mouth daily.    . Multiple Vitamin (MULTIVITAMIN WITH MINERALS) TABS Take 1 tablet by mouth every other day.    . pentoxifylline (TRENTAL) 400 MG CR tablet Take 400 mg by mouth 3 (three) times daily with meals.    . zinc gluconate 50 MG tablet Take 50 mg by mouth every other day.    . metoprolol tartrate (LOPRESSOR) 25 MG tablet Take 1 tablet (25 mg  total) by mouth 2 (two) times daily. 60 tablet 1   No current facility-administered medications for this visit.      Allergies:   Patient has no known allergies.   Social History:  The patient  reports that he has never smoked. He has never used smokeless tobacco. He reports that he drinks alcohol. He reports that he does not use drugs.   Family History:   family history includes Cancer in his father; Stroke in his mother.    Review of Systems: Review of Systems  Constitutional: Negative.   Respiratory: Negative.   Cardiovascular: Negative.   Gastrointestinal: Negative.   Musculoskeletal: Negative.   Neurological: Negative.   Psychiatric/Behavioral: Negative.   All other systems reviewed and are  negative.    PHYSICAL EXAM: VS:  BP 126/64 (BP Location: Left Arm, Patient Position: Sitting, Cuff Size: Normal)   Pulse 63   Ht 5\' 11"  (1.803 m)   Wt 197 lb 4 oz (89.5 kg)   BMI 27.51 kg/m  , BMI Body mass index is 27.51 kg/m. GEN: Well nourished, well developed, in no acute distress , obese HEENT: normal  Neck: no JVD, carotid bruits, or masses Cardiac: RRR; no murmurs, rubs, or gallops,no edema  Respiratory:  clear to auscultation bilaterally, normal work of breathing GI: soft, nontender, nondistended, + BS MS: no deformity or atrophy  Skin: warm and dry, no rash Neuro:  Strength and sensation are intact Psych: euthymic mood, full affect    Recent Labs: No results found for requested labs within last 8760 hours.    Lipid Panel No results found for: CHOL, HDL, LDLCALC, TRIG    Wt Readings from Last 3 Encounters:  03/05/17 197 lb 4 oz (89.5 kg)  09/04/16 200 lb (90.7 kg)  11/15/15 215 lb (97.5 kg)       ASSESSMENT AND PLAN:  Essential hypertension - Plan: EKG 12-Lead Blood pressure is well controlled on today's visit. No changes made to the medications.  Mixed hyperlipidemia Cholesterol is at goal on the current lipid regimen. No changes to the medications were made.  Coronary artery disease involving native coronary artery of native heart without angina pectoris Currently with no symptoms of angina. No further workup at this time. Continue current medication regimen.  Type 2 diabetes mellitus with other circulatory complication, without long-term current use of insulin (HCC) Previously Followed by Dr. Johny Chess.  Doing well with weight loss  S/P CABG x 4 Denies anginal symptoms No further testing at this time He will call the office if he develops symptoms changes   Total encounter time more than 25 minutes  Greater than 50% was spent in counseling and coordination of care with the patient  Disposition:   F/U  12 months   Total encounter time more  than 25 minutes  Greater than 50% was spent in counseling and coordination of care with the patient    Orders Placed This Encounter  Procedures  . EKG 12-Lead     Signed, Dossie Arbour, M.D., Ph.D. 03/05/2017  [facility] [place], [place] [phone]

## 2017-03-05 ENCOUNTER — Encounter: Payer: Self-pay | Admitting: Cardiovascular Disease

## 2017-03-05 ENCOUNTER — Ambulatory Visit (INDEPENDENT_AMBULATORY_CARE_PROVIDER_SITE_OTHER): Payer: Medicare Other | Admitting: Cardiovascular Disease

## 2017-03-05 VITALS — BP 126/64 | HR 63 | Ht 71.0 in | Wt 197.2 lb

## 2017-03-05 DIAGNOSIS — I10 Essential (primary) hypertension: Secondary | ICD-10-CM

## 2017-03-05 DIAGNOSIS — E782 Mixed hyperlipidemia: Secondary | ICD-10-CM | POA: Diagnosis not present

## 2017-03-05 DIAGNOSIS — I25118 Atherosclerotic heart disease of native coronary artery with other forms of angina pectoris: Secondary | ICD-10-CM

## 2017-03-05 DIAGNOSIS — Z951 Presence of aortocoronary bypass graft: Secondary | ICD-10-CM

## 2017-03-05 DIAGNOSIS — E1159 Type 2 diabetes mellitus with other circulatory complications: Secondary | ICD-10-CM

## 2017-03-05 NOTE — Patient Instructions (Signed)

## 2017-04-12 ENCOUNTER — Telehealth: Payer: Self-pay | Admitting: Cardiovascular Disease

## 2017-04-12 NOTE — Telephone Encounter (Signed)
S/w Tiffany. Provider at [facility] wanted to make sure patient should still be on the aspirin. Patient last saw Dr Mariah Milling on 03/05/17, Aspirin 325 mg daily was on patient's med list and Dr Mariah Milling did not make any medications changes.  Advised Tiffany that Dr Mariah Milling still has patient on the aspirin. She verbalized understanding.

## 2017-04-12 NOTE — Telephone Encounter (Signed)
[facility] r[facility] Tiffany calling to clarify need for continued dose of 325 mg asa   Please call.

## 2017-07-01 ENCOUNTER — Encounter: Payer: Self-pay | Admitting: *Deleted

## 2017-07-01 NOTE — Discharge Instructions (Signed)

## 2017-07-07 ENCOUNTER — Ambulatory Visit
Admission: RE | Admit: 2017-07-07 | Discharge: 2017-07-07 | Disposition: A | Discharge status: Home / Self Care | Payer: Medicare Other | Source: Ambulatory Visit | Attending: Ophthalmology | Admitting: Ophthalmology

## 2017-07-07 ENCOUNTER — Encounter
Admission: RE | Disposition: A | Discharge status: Home / Self Care | Payer: Self-pay | Source: Ambulatory Visit | Attending: Ophthalmology

## 2017-07-07 ENCOUNTER — Ambulatory Visit: Payer: Medicare Other | Admitting: Anesthesiology

## 2017-07-07 DIAGNOSIS — M1990 Unspecified osteoarthritis, unspecified site: Secondary | ICD-10-CM | POA: Insufficient documentation

## 2017-07-07 DIAGNOSIS — H2512 Age-related nuclear cataract, left eye: Secondary | ICD-10-CM | POA: Insufficient documentation

## 2017-07-07 DIAGNOSIS — F419 Anxiety disorder, unspecified: Secondary | ICD-10-CM | POA: Diagnosis not present

## 2017-07-07 DIAGNOSIS — M109 Gout, unspecified: Secondary | ICD-10-CM | POA: Insufficient documentation

## 2017-07-07 DIAGNOSIS — I2510 Atherosclerotic heart disease of native coronary artery without angina pectoris: Secondary | ICD-10-CM | POA: Insufficient documentation

## 2017-07-07 DIAGNOSIS — I129 Hypertensive chronic kidney disease with stage 1 through stage 4 chronic kidney disease, or unspecified chronic kidney disease: Secondary | ICD-10-CM | POA: Insufficient documentation

## 2017-07-07 DIAGNOSIS — N183 Chronic kidney disease, stage 3 (moderate): Secondary | ICD-10-CM | POA: Insufficient documentation

## 2017-07-07 DIAGNOSIS — E1122 Type 2 diabetes mellitus with diabetic chronic kidney disease: Secondary | ICD-10-CM | POA: Insufficient documentation

## 2017-07-07 DIAGNOSIS — G4730 Sleep apnea, unspecified: Secondary | ICD-10-CM | POA: Insufficient documentation

## 2017-07-07 DIAGNOSIS — E1140 Type 2 diabetes mellitus with diabetic neuropathy, unspecified: Secondary | ICD-10-CM | POA: Diagnosis not present

## 2017-07-07 DIAGNOSIS — Z79899 Other long term (current) drug therapy: Secondary | ICD-10-CM | POA: Insufficient documentation

## 2017-07-07 DIAGNOSIS — Z951 Presence of aortocoronary bypass graft: Secondary | ICD-10-CM | POA: Diagnosis not present

## 2017-07-07 DIAGNOSIS — Z7984 Long term (current) use of oral hypoglycemic drugs: Secondary | ICD-10-CM | POA: Diagnosis not present

## 2017-07-07 HISTORY — PX: CATARACT EXTRACTION W/PHACO: SHX586

## 2017-07-07 LAB — GLUCOSE, CAPILLARY
GLUCOSE-CAPILLARY: 86 mg/dL (ref 65–99)
Glucose-Capillary: 91 mg/dL (ref 65–99)

## 2017-07-07 SURGERY — PHACOEMULSIFICATION, CATARACT, WITH IOL INSERTION
Anesthesia: Monitor Anesthesia Care | Site: Eye | Laterality: Left | Wound class: Clean

## 2017-07-07 MED ORDER — NA HYALUR & NA CHOND-NA HYALUR 0.4-0.35 ML IO KIT
PACK | INTRAOCULAR | Status: DC | PRN
  Administered 2017-07-07: 1 mL via INTRAOCULAR

## 2017-07-07 MED ORDER — LIDOCAINE HCL (PF) 2 % IJ SOLN
INTRAMUSCULAR | Status: DC | PRN
  Administered 2017-07-07: 1 mL

## 2017-07-07 MED ORDER — CEFUROXIME OPHTHALMIC INJECTION 1 MG/0.1 ML
INJECTION | OPHTHALMIC | Status: DC | PRN
  Administered 2017-07-07: .2 mL via INTRACAMERAL

## 2017-07-07 MED ORDER — ARMC OPHTHALMIC DILATING DROPS
1.0000 "application " | OPHTHALMIC | Status: DC | PRN
  Administered 2017-07-07 (×3): 1 via OPHTHALMIC

## 2017-07-07 MED ORDER — BRIMONIDINE TARTRATE-TIMOLOL 0.2-0.5 % OP SOLN
OPHTHALMIC | Status: DC | PRN
  Administered 2017-07-07: 1 [drp] via OPHTHALMIC

## 2017-07-07 MED ORDER — MIDAZOLAM HCL 2 MG/2ML IJ SOLN
INTRAMUSCULAR | Status: DC | PRN
  Administered 2017-07-07: 2 mg via INTRAVENOUS

## 2017-07-07 MED ORDER — LACTATED RINGERS IV SOLN: 1000.0000 mL | INTRAVENOUS | Status: DC

## 2017-07-07 MED ORDER — EPINEPHRINE PF 1 MG/ML IJ SOLN
INTRAOCULAR | Status: DC | PRN
  Administered 2017-07-07: 83 mL via OPHTHALMIC

## 2017-07-07 MED ORDER — FENTANYL CITRATE (PF) 100 MCG/2ML IJ SOLN
INTRAMUSCULAR | Status: DC | PRN
  Administered 2017-07-07: 50 ug via INTRAVENOUS

## 2017-07-07 MED ORDER — MOXIFLOXACIN HCL 0.5 % OP SOLN
1.0000 [drp] | OPHTHALMIC | Status: DC | PRN
  Administered 2017-07-07 (×3): 1 [drp] via OPHTHALMIC

## 2017-07-07 SURGICAL SUPPLY — 25 items
CANNULA ANT/CHMB 27GA (MISCELLANEOUS) ×3 IMPLANT
CARTRIDGE ABBOTT (MISCELLANEOUS) IMPLANT
GLOVE SURG LX 7.5 STRW (GLOVE) ×2
GLOVE SURG LX STRL 7.5 STRW (GLOVE) ×1 IMPLANT
GLOVE SURG TRIUMPH 8.0 PF LTX (GLOVE) ×3 IMPLANT
GOWN STRL REUS W/ TWL LRG LVL3 (GOWN DISPOSABLE) ×2 IMPLANT
GOWN STRL REUS W/TWL LRG LVL3 (GOWN DISPOSABLE) ×4
LENS IOL TECNIS ITEC 22.0 (Intraocular Lens) ×3 IMPLANT
MARKER SKIN DUAL TIP RULER LAB (MISCELLANEOUS) ×3 IMPLANT
NDL RETROBULBAR .5 NSTRL (NEEDLE) IMPLANT
NEEDLE FILTER BLUNT 18X 1/2SAF (NEEDLE) ×2
NEEDLE FILTER BLUNT 18X1 1/2 (NEEDLE) ×1 IMPLANT
PACK CATARACT BRASINGTON (MISCELLANEOUS) ×3 IMPLANT
PACK EYE AFTER SURG (MISCELLANEOUS) ×3 IMPLANT
PACK OPTHALMIC (MISCELLANEOUS) ×3 IMPLANT
RING MALYGIN 7.0 (MISCELLANEOUS) IMPLANT
SUT ETHILON 10-0 CS-B-6CS-B-6 (SUTURE)
SUT VICRYL  9 0 (SUTURE)
SUT VICRYL 9 0 (SUTURE) IMPLANT
SUTURE EHLN 10-0 CS-B-6CS-B-6 (SUTURE) IMPLANT
SYR 3ML LL SCALE MARK (SYRINGE) ×3 IMPLANT
SYR 5ML LL (SYRINGE) ×3 IMPLANT
SYR TB 1ML LUER SLIP (SYRINGE) ×3 IMPLANT
WATER STERILE IRR 250ML POUR (IV SOLUTION) ×3 IMPLANT
WIPE NON LINTING 3.25X3.25 (MISCELLANEOUS) ×3 IMPLANT

## 2017-07-07 NOTE — Transfer of Care (Signed)
Immediate Anesthesia Transfer of Care Note  Patient: [name] [name]  Procedure(s) Performed: CATARACT EXTRACTION PHACO AND INTRAOCULAR LENS PLACEMENT (IOC) LEFT DIABETIC (Left Eye)  Patient Location: PACU  Anesthesia Type: MAC  Level of Consciousness: awake, alert  and patient cooperative  Airway and Oxygen Therapy: Patient Spontanous Breathing and Patient connected to supplemental oxygen  Post-op Assessment: Post-op Vital signs reviewed, Patient's Cardiovascular Status Stable, Respiratory Function Stable, Patent Airway and No signs of Nausea or vomiting  Post-op Vital Signs: Reviewed and stable  Complications: No apparent anesthesia complications

## 2017-07-07 NOTE — Anesthesia Preprocedure Evaluation (Signed)
Anesthesia Evaluation  Patient identified by MRN, date of birth, ID band Patient awake    Reviewed: Allergy & Precautions, H&P , NPO status , Patient's Chart, lab work & pertinent test results  Airway Mallampati: II  TM Distance: >3 FB Neck ROM: full    Dental no notable dental hx.    Pulmonary sleep apnea ,    Pulmonary exam normal breath sounds clear to auscultation       Cardiovascular hypertension, + CAD  Normal cardiovascular exam Rhythm:regular Rate:Normal     Neuro/Psych    GI/Hepatic   Endo/Other  diabetes  Renal/GU      Musculoskeletal   Abdominal   Peds  Hematology   Anesthesia Other Findings   Reproductive/Obstetrics                             Anesthesia Physical Anesthesia Plan  ASA: III  Anesthesia Plan: MAC   Post-op Pain Management:    Induction:   PONV Risk Score and Plan: 1 and Midazolam  Airway Management Planned:   Additional Equipment:   Intra-op Plan:   Post-operative Plan:   Informed Consent: I have reviewed the patients History and Physical, chart, labs and discussed the procedure including the risks, benefits and alternatives for the proposed anesthesia with the patient or authorized representative who has indicated his/her understanding and acceptance.     Plan Discussed with: CRNA  Anesthesia Plan Comments:         Anesthesia Quick Evaluation

## 2017-07-07 NOTE — Anesthesia Procedure Notes (Signed)
Procedure Name: MAC Performed by: Londell Moh, CRNA Pre-anesthesia Checklist: Patient identified, Emergency Drugs available, Suction available, Timeout performed and Patient being monitored Patient Re-evaluated:Patient Re-evaluated prior to induction Oxygen Delivery Method: Nasal cannula Placement Confirmation: positive ETCO2

## 2017-07-07 NOTE — H&P (Signed)
The History and Physical notes are on paper, have been signed, and are to be scanned. The patient remains stable and unchanged from the H&P.   Previous H&P reviewed, patient examined, and there are no changes.  [name] [name] 07/07/2017 8:17 AM

## 2017-07-07 NOTE — Anesthesia Postprocedure Evaluation (Signed)
Anesthesia Post Note  Patient: [name] [name]  Procedure(s) Performed: CATARACT EXTRACTION PHACO AND INTRAOCULAR LENS PLACEMENT (IOC) LEFT DIABETIC (Left Eye)  Patient location during evaluation: PACU Anesthesia Type: MAC Level of consciousness: awake and alert and oriented Pain management: satisfactory to patient Vital Signs Assessment: post-procedure vital signs reviewed and stable Respiratory status: spontaneous breathing, nonlabored ventilation and respiratory function stable Cardiovascular status: blood pressure returned to baseline and stable Postop Assessment: Adequate PO intake and No signs of nausea or vomiting Anesthetic complications: no    Raliegh Ip

## 2017-07-07 NOTE — Op Note (Signed)
OPERATIVE NOTE  Baldo Ashlbert R Hults [id number] 07/07/2017   PREOPERATIVE DIAGNOSIS:  Nuclear sclerotic cataract left eye. H25.12   POSTOPERATIVE DIAGNOSIS:    Nuclear sclerotic cataract left eye.     PROCEDURE:  Phacoemusification with posterior chamber intraocular lens placement of the left eye   LENS:   Implant Name Type Inv. Item Serial No. Manufacturer Lot No. LRB No. Used  LENS IOL DIOP 22.0 - [id number] Intraocular Lens LENS IOL DIOP 22.0 [id number]  Left 1        ULTRASOUND TIME: 23  % of 1 minutes 39 seconds, CDE 23.0  SURGEON:  Deirdre Evenerhadwick R. Rook Maue, MD   ANESTHESIA:  Topical with tetracaine drops and 2% Xylocaine jelly, augmented with 1% preservative-free intracameral lidocaine.    COMPLICATIONS:  None.   DESCRIPTION OF PROCEDURE:  The patient was identified in the holding room and transported to the operating room and placed in the supine position under the operating microscope.  The left eye was identified as the operative eye and it was prepped and draped in the usual sterile ophthalmic fashion.   A 1 millimeter clear-corneal paracentesis was made at the 1:30 position.  0.5 ml of preservative-free 1% lidocaine was injected into the anterior chamber.  The anterior chamber was filled with Viscoat viscoelastic.  A 2.4 millimeter keratome was used to make a near-clear corneal incision at the 10:30 position.  .  A curvilinear capsulorrhexis was made with a cystotome and capsulorrhexis forceps.  Balanced salt solution was used to hydrodissect and hydrodelineate the nucleus.   Phacoemulsification was then used in stop and chop fashion to remove the lens nucleus and epinucleus.  The remaining cortex was then removed using the irrigation and aspiration handpiece. Provisc was then placed into the capsular bag to distend it for lens placement.  A lens was then injected into the capsular bag.  The remaining viscoelastic was aspirated.   Wounds were hydrated with balanced salt  solution.  The anterior chamber was inflated to a physiologic pressure with balanced salt solution.  No wound leaks were noted. Cefuroxime 0.1 ml of a 10mg /ml solution was injected into the anterior chamber for a dose of 1 mg of intracameral antibiotic at the completion of the case.   Timolol and Brimonidine drops were applied to the eye.  The patient was taken to the recovery room in stable condition without complications of anesthesia or surgery.  Amahia Madonia 07/07/2017, 9:01 AM

## 2017-07-09 ENCOUNTER — Encounter: Payer: Self-pay | Admitting: Ophthalmology

## 2018-04-26 ENCOUNTER — Ambulatory Visit: Payer: Medicare Other | Admitting: Urology

## 2018-04-26 ENCOUNTER — Encounter: Payer: Self-pay | Admitting: Urology

## 2018-04-26 VITALS — BP 155/71 | HR 67 | Ht 71.0 in | Wt 179.9 lb

## 2018-04-26 DIAGNOSIS — Z8551 Personal history of malignant neoplasm of bladder: Secondary | ICD-10-CM

## 2018-04-26 NOTE — Progress Notes (Signed)
04/26/2018 1:08 PM   [name] [name] [birth year] [id number]  Referring provider: Nicholaus Corolla, MD [address] [place], [place] [address]  CC: Hx of LG bladder cancer  HPI: I had the pleasure of seeing [name] [name] in urology clinic today to establish care for history of bladder cancer.  He is an 84 year old male that was a prior patient of Dr. Achilles Dunk at [facility] diagnosed with bladder cancer in 2015.  He presented with microscopic hematuria and cystoscopy showed a small less than 3 cm bladder tumor.  He underwent TURBT in July 2015 with instillation of mitomycin-C, and pathology showed low-grade Ta disease.  Per chart review, there have been no recurrences since that time.  He denies any episodes of flank pain, gross hematuria, or weight loss.  His past medical history is notable for CABG in 2015.  He has never smoked, denies any prior exposures to carcinogenics.  PMH: Past Medical History:  Diagnosis Date  . CAD (coronary artery disease)   . Diabetes mellitus (HCC)   . Gout   . HTN (hypertension)   . Hyperlipidemia   . S/P CABG x 4 03/11/2012   LIMA to mid LAD, SVG to distal LAD, SVG to OM, SVG to PDA, EVH via right thigh and leg  . Shortness of breath    With ambulation  . Sleep apnea    Does not wear machine    Surgical History: Past Surgical History:  Procedure Laterality Date  . APPENDECTOMY    . BLADDER SURGERY    . CARDIAC CATHETERIZATION  2013   [facility]  . CATARACT EXTRACTION W/PHACO Left 07/07/2017   Procedure: CATARACT EXTRACTION PHACO AND INTRAOCULAR LENS PLACEMENT (IOC) LEFT DIABETIC;  Surgeon: Lockie Mola, MD;  Location: [facility];  Service: Ophthalmology;  Laterality: Left;  diabetic - oral meds  . COLONOSCOPY    . CORONARY ARTERY BYPASS GRAFT  03/11/2012   Procedure: CORONARY ARTERY BYPASS GRAFTING (CABG);  Surgeon: Purcell Nails, MD;  Location: [facility] OR;  Service: Open Heart Surgery;  Laterality: N/A;    Allergies: No Known Allergies  Family  History: Family History  Problem Relation Age of Onset  . Stroke Mother   . Cancer Father     Social History:  reports that he has never smoked. He has never used smokeless tobacco. He reports that he drinks alcohol. He reports that he does not use drugs.  ROS: Please see flowsheet from today's date for complete review of systems.  Physical Exam: BP (!) 155/71 (BP Location: Left Arm, Patient Position: Sitting, Cuff Size: Normal)   Pulse 67   Ht 5\' 11"  (1.803 m)   Wt 179 lb 14.4 oz (81.6 kg)   BMI 25.09 kg/m    Constitutional:  Alert and oriented, No acute distress. Cardiovascular: No clubbing, cyanosis, or edema. Respiratory: Normal respiratory effort, no increased work of breathing. GI: Abdomen is soft, nontender, nondistended, no abdominal masses GU: No CVA tenderness Lymph: No cervical or inguinal lymphadenopathy. Skin: No rashes, bruises or suspicious lesions. Neurologic: Grossly intact, no focal deficits, moving all 4 extremities. Psychiatric: Normal mood and affect.   Assessment & Plan:   In summary, Mr. [name] [name] is an 84 year old male with a history of low-grade non-invasive bladder cancer diagnosed in July 2015 by Dr. Achilles Dunk at [facility].  He has had no recurrences since that time.  We will continue surveillance per the AUA guidelines with yearly cystoscopy.    Schedule cystoscopy, continue surveillance  Sondra Come, MD  [place] [address] [place], [place] [address] [phone]

## 2018-05-09 ENCOUNTER — Ambulatory Visit (INDEPENDENT_AMBULATORY_CARE_PROVIDER_SITE_OTHER): Payer: Medicare Other | Admitting: Urology

## 2018-05-09 ENCOUNTER — Other Ambulatory Visit: Payer: Self-pay

## 2018-05-09 ENCOUNTER — Encounter: Payer: Self-pay | Admitting: Urology

## 2018-05-09 DIAGNOSIS — Z8551 Personal history of malignant neoplasm of bladder: Secondary | ICD-10-CM

## 2018-05-09 NOTE — Progress Notes (Signed)
Bladder cancer surveillance note  INDICATION Hx of LG Ta UCC  UROLOGIC HISTORY [name] [name] is a 84 y.o. male with history of TURBT July 2015 with Dr. Achilles Dunkope for a 3 cm bladder tumor with final pathology showing low-grade Ta urothelial cell carcinoma.  Mitomycin-C was instilled postoperatively.  He has had no recurrences since that time.  Initial Diagnosis of Bladder  Year: 02/2014 Pathology: LG Ta  Recurrent Bladder Cancer Diagnosis/= None  Treatments for Bladder Cancer TURBT 02/2014  AUA Risk Category Low risk  Cystoscopy Procedure Note:  After informed consent and discussion of the procedure and its risks, [name] [name] was positioned and prepped in the standard fashion. Cystoscopy was performed with the a flexible cystoscope. The urethra, bladder neck and entire bladder was visualized in a standard fashion, and mucosa was grossly normal throughout. The ureteral orifices were visualized in their normal location and orientation.   Plan  1. Surveillance cysto in one year  [name] RamsBrian Kiante Petrovich, MD 05/09/2018

## 2018-05-10 LAB — URINALYSIS, COMPLETE
BILIRUBIN UA: NEGATIVE
Glucose, UA: NEGATIVE
LEUKOCYTES UA: NEGATIVE
Nitrite, UA: NEGATIVE
PH UA: 5.5 (ref 5.0–7.5)
PROTEIN UA: NEGATIVE
RBC UA: NEGATIVE
SPEC GRAV UA: 1.025 (ref 1.005–1.030)
Urobilinogen, Ur: 1 mg/dL (ref 0.2–1.0)

## 2018-05-10 LAB — MICROSCOPIC EXAMINATION
Bacteria, UA: NONE SEEN
Epithelial Cells (non renal): NONE SEEN /hpf (ref 0–10)
RBC, UA: NONE SEEN /hpf (ref 0–2)

## 2018-10-03 NOTE — Progress Notes (Signed)
Cardiology Office Note Date:  10/04/2018  Patient ID:  [name], [name] 1932/04/03, MRN [id number] PCP:  Nicholaus Corolla, MD  Cardiologist:  Dr. Mariah Milling, MD    Chief Complaint: Follow up  History of Present Illness: [name] [name] is a 85 y.o. male with history of CAD status post 4-vessel CABG in 2013 with LIMA to LAD, SVG to distal LAD, SVG to PDA SVG to OM per note, diabetes, hypertension, hyperlipidemia, sleep apnea noncompliant with CPAP, and gout who presents for follow-up of his CAD.  Prior cath in 01/2012 showed severe multivessel disease with 40% ostial LAD, 60% followed by 90% proximal LAD, 90% distal LAD, 60% followed by 70% ostial and proximal LCx, 30% OM 2, 80% proximal RCA stenoses.  Normal LVEF.  In this setting, he underwent four-vessel CABG as outlined above.  He has not had any ischemic evaluations since his bypass.  He was last seen in the office in 02/2017 for routine follow-up and was feeling well.  He had self discontinued his Crestor and preferred not to restart.  Labs: 03/2016 - HDL 32, LDL not included in labs, serum creatinine 1.14, Hgb 13.5, AST/ALT normal, TSH 3.660 2018 - serum creatinine 1.31, potassium 4.7, albumin 4.0, A1c 6.9 2017 - LDL 60  He comes in accompanied by his wife today.  He notes an approximate 43-month history of intermittent substernal chest pain that occurs at rest and will last for several seconds and spontaneously resolved.  There are no associated symptoms.  Pain does not feel similar to his symptoms leading up to his bypass though he is unable to describe the difference.  He is currently chest pain-free.  He is compliant with all medications.  Both the patient and his wife continue to refuse statin therapy secondary to myalgias.  He is taking a full dose aspirin for unclear reasons.  He reports well-controlled blood pressure at home.  He denies any lower extremity swelling, abdominal distention, orthopnea, PND, or early satiety.  No falls since  he was last seen.  No BRBPR or melena.  No dizziness, presyncope, or syncope.   Past Medical History:  Diagnosis Date  . CAD (coronary artery disease)   . Diabetes mellitus (HCC)   . Gout   . HTN (hypertension)   . Hyperlipidemia   . S/P CABG x 4 03/11/2012   LIMA to mid LAD, SVG to distal LAD, SVG to OM, SVG to PDA, EVH via right thigh and leg  . Shortness of breath    With ambulation  . Sleep apnea    Does not wear machine    Past Surgical History:  Procedure Laterality Date  . APPENDECTOMY    . BLADDER SURGERY    . CARDIAC CATHETERIZATION  2013   [facility]  . CATARACT EXTRACTION W/PHACO Left 07/07/2017   Procedure: CATARACT EXTRACTION PHACO AND INTRAOCULAR LENS PLACEMENT (IOC) LEFT DIABETIC;  Surgeon: Lockie Mola, MD;  Location: [facility];  Service: Ophthalmology;  Laterality: Left;  diabetic - oral meds  . COLONOSCOPY    . CORONARY ARTERY BYPASS GRAFT  03/11/2012   Procedure: CORONARY ARTERY BYPASS GRAFTING (CABG);  Surgeon: Purcell Nails, MD;  Location: [facility] OR;  Service: Open Heart Surgery;  Laterality: N/A;    Current Meds  Medication Sig  . Ascorbic Acid (VITAMIN C) 1000 MG tablet Take 1,000 mg by mouth daily.  Marland Kitchen aspirin 325 MG EC tablet Take 325 mg by mouth daily.  . cholecalciferol (VITAMIN D) 1000 units tablet Take  1,000 Units by mouth daily.  . Cinnamon 500 MG capsule Take 1,000 mg by mouth daily.  Marland Kitchen. glipiZIDE (GLUCOTROL XL) 10 MG 24 hr tablet Take 10 mg by mouth 2 (two) times daily.   Marland Kitchen. linagliptin (TRADJENTA) 5 MG TABS tablet Take 5 mg by mouth daily.  Marland Kitchen. losartan (COZAAR) 100 MG tablet Take 100 mg by mouth daily.   . metFORMIN (GLUCOPHAGE) 500 MG tablet Take 500 mg by mouth daily.  . metoprolol tartrate (LOPRESSOR) 25 MG tablet Take 1 tablet (25 mg total) by mouth 2 (two) times daily.  . Multiple Vitamin (MULTIVITAMIN WITH MINERALS) TABS Take 1 tablet by mouth every other day.  . pentoxifylline (TRENTAL) 400 MG CR tablet Take 400 mg by mouth 2  (two) times daily.   Marland Kitchen. zinc gluconate 50 MG tablet Take 50 mg by mouth every other day.    Allergies:   Patient has no known allergies.   Social History:  The patient  reports that he has never smoked. He has never used smokeless tobacco. He reports current alcohol use. He reports that he does not use drugs.   Family History:  The patient's family history includes Cancer in his father; Stroke in his mother.  ROS:   Review of Systems  Constitutional: Negative for chills, diaphoresis, fever, malaise/fatigue and weight loss.  HENT: Negative for congestion.   Eyes: Negative for discharge and redness.  Respiratory: Negative for cough, hemoptysis, sputum production, shortness of breath and wheezing.   Cardiovascular: Positive for chest pain. Negative for palpitations, orthopnea, claudication, leg swelling and PND.  Gastrointestinal: Negative for abdominal pain, blood in stool, heartburn, melena, nausea and vomiting.  Genitourinary: Negative for hematuria.  Musculoskeletal: Negative for falls and myalgias.  Skin: Negative for rash.  Neurological: Negative for dizziness, tingling, tremors, sensory change, speech change, focal weakness, loss of consciousness and weakness.  Endo/Heme/Allergies: Does not bruise/bleed easily.  Psychiatric/Behavioral: Negative for substance abuse. The patient is not nervous/anxious.   All other systems reviewed and are negative.    PHYSICAL EXAM:  VS:  BP 120/60 (BP Location: Left Arm, Patient Position: Sitting, Cuff Size: Normal)   Pulse 62   Ht 5\' 9"  (1.753 m)   Wt 181 lb (82.1 kg)   BMI 26.73 kg/m  BMI: Body mass index is 26.73 kg/m.  Physical Exam  Constitutional: He is oriented to person, place, and time. He appears well-developed and well-nourished.  HENT:  Head: Normocephalic and atraumatic.  Eyes: Right eye exhibits no discharge. Left eye exhibits no discharge.  Neck: Normal range of motion. No JVD present.  Cardiovascular: Normal rate, regular  rhythm, S1 normal, S2 normal and normal heart sounds. Exam reveals no distant heart sounds, no friction rub, no midsystolic click and no opening snap.  No murmur heard. Pulses:      Posterior tibial pulses are 2+ on the right side and 2+ on the left side.  Pulmonary/Chest: Effort normal and breath sounds normal. No respiratory distress. He has no decreased breath sounds. He has no wheezes. He has no rales. He exhibits no tenderness.  Abdominal: Soft. He exhibits no distension. There is no abdominal tenderness.  Musculoskeletal:        General: No edema.  Neurological: He is alert and oriented to person, place, and time.  Skin: Skin is warm and dry. No cyanosis. Nails show no clubbing.  Psychiatric: He has a normal mood and affect. His speech is normal and behavior is normal. Judgment and thought content normal.  EKG:  Was ordered and interpreted by me today. Shows NSR, 62 bpm, left axis deviation, 1st degree AV block, possible prior septal infarct, no acute st/t changes (unchanged from prior)  Recent Labs: No results found for requested labs within last 8760 hours.  No results found for requested labs within last 8760 hours.   CrCl cannot be calculated (Patient's most recent lab result is older than the maximum 21 days allowed.).   Wt Readings from Last 3 Encounters:  10/04/18 181 lb (82.1 kg)  04/26/18 179 lb 14.4 oz (81.6 kg)  07/07/17 203 lb (92.1 kg)     Other studies reviewed: Additional studies/records reviewed today include: summarized above  ASSESSMENT AND PLAN:  1. Chest pain with moderate risk for cardiac etiology: Currently chest pain-free.  Schedule treadmill Myoview to evaluate for high risk ischemia.  2. CAD involving the native coronary arteries status post CABG: Currently chest pain-free.  Plan to evaluate for high risk ischemia with nuclear stress test as above.  Decrease full dose aspirin to aspirin 81 mg daily.  Continue Lopressor 25 mg twice daily.  He has  self discontinued multiple statins secondary to myalgias.  No recent LDL on file.  Check direct LDL with consideration for PCSK9 inhibitor if LDL remains above goal of 70.  Patient indicates he will think about this.  3. Hypertension: Blood pressure is well controlled today.  Continue losartan and Lopressor.  4. Hyperlipidemia with statin intolerance: Patient has failed multiple statins.  Check direct LDL and if this remains above goal of less than 70 consider PCSK9 inhibitor.  5. Sleep apnea: Noncompliant with CPAP.  Compliance is recommended.  Disposition: F/u with Dr. Mariah Milling or an APP in 3 months.  Current medicines are reviewed at length with the patient today.  The patient did not have any concerns regarding medicines.  Signed, Eula Listen, PA-C 10/04/2018 10:45 AM     [facility] [address] [place], [place] [address] [phone]

## 2018-10-04 ENCOUNTER — Ambulatory Visit: Payer: Medicare Other | Admitting: Physician Assistant

## 2018-10-04 ENCOUNTER — Encounter: Payer: Self-pay | Admitting: Physician Assistant

## 2018-10-04 VITALS — BP 120/60 | HR 62 | Ht 69.0 in | Wt 181.0 lb

## 2018-10-04 DIAGNOSIS — Z789 Other specified health status: Secondary | ICD-10-CM

## 2018-10-04 DIAGNOSIS — G4730 Sleep apnea, unspecified: Secondary | ICD-10-CM

## 2018-10-04 DIAGNOSIS — R079 Chest pain, unspecified: Secondary | ICD-10-CM

## 2018-10-04 DIAGNOSIS — I10 Essential (primary) hypertension: Secondary | ICD-10-CM

## 2018-10-04 DIAGNOSIS — E785 Hyperlipidemia, unspecified: Secondary | ICD-10-CM

## 2018-10-04 DIAGNOSIS — I25118 Atherosclerotic heart disease of native coronary artery with other forms of angina pectoris: Secondary | ICD-10-CM

## 2018-10-04 MED ORDER — ASPIRIN EC 81 MG PO TBEC: 81.0000 mg | DELAYED_RELEASE_TABLET | Freq: Every day | ORAL | 3 refills | Status: DC

## 2018-10-04 NOTE — Patient Instructions (Signed)
Medication Instructions:  Your physician has recommended you make the following change in your medication:  1- DECREASE Aspirin to 1 tablet (81 mg ) once daily If you need a refill on your cardiac medications before your next appointment, please call your pharmacy.   Lab work: Your physician recommends that you return for lab work today ( direct LDL,  lipid).   If you have labs (blood work) drawn today and your tests are completely normal, you will receive your results only by: Marland Kitchen MyChart Message (if you have MyChart) OR . A paper copy in the mail If you have any lab test that is abnormal or we need to change your treatment, we will call you to review the results.  Testing/Procedures: 1- Treadmill Myoview  [facility] MYOVIEW  Your caregiver has ordered a Stress Test with nuclear imaging. The purpose of this test is to evaluate the blood supply to your heart muscle. This procedure is referred to as a "Non-Invasive Stress Test." This is because other than having an IV started in your vein, nothing is inserted or "invades" your body. Cardiac stress tests are done to find areas of poor blood flow to the heart by determining the extent of coronary artery disease (CAD). Some patients exercise on a treadmill, which naturally increases the blood flow to your heart, while others who are  unable to walk on a treadmill due to physical limitations have a pharmacologic/chemical stress agent called Lexiscan . This medicine will mimic walking on a treadmill by temporarily increasing your coronary blood flow.   Please note: these test may take anywhere between 2-4 hours to complete  PLEASE REPORT TO [facility]ICAL [facility]ALL ENTRANCE  THE VOLUNTEERS AT THE FIRST DESK WILL DIRECT YOU WHERE TO GO  Date of Procedure:_____________________________________  Arrival Time for Procedure:______________________________  Instructions regarding medication:   __x__ : Hold diabetes medication morning of procedure  (glipizide)  __x__:  Hold betablocker(s) night before procedure and morning of procedure (metoprolol)  ________________________________________________  PLEASE NOTIFY THE OFFICE AT LEAST 24 HOURS IN ADVANCE IF YOU ARE UNABLE TO KEEP YOUR APPOINTMENT.  [phone] AND  PLEASE NOTIFY NUCLEAR MEDICINE AT [facility] AT LEAST 24 HOURS IN ADVANCE IF YOU ARE UNABLE TO KEEP YOUR APPOINTMENT. [phone]  How to prepare for your Myoview test:  1. Do not eat or drink after midnight 2. No caffeine for 24 hours prior to test 3. No smoking 24 hours prior to test. 4. Your medication may be taken with water.  If your doctor stopped a medication because of this test, do not take that medication. 5. Ladies, please do not wear dresses.  Skirts or pants are appropriate. Please wear a short sleeve shirt. 6. No perfume, cologne or lotion. 7. Wear comfortable walking shoes. No heels!  Follow-Up: At [facility], you and your health needs are our priority.  As part of our continuing mission to provide you with exceptional heart care, we have created designated Provider Care Teams.  These Care Teams include your primary Cardiologist (physician) and Advanced Practice Providers (APPs -  Physician Assistants and Nurse Practitioners) who all work together to provide you with the care you need, when you need it. You will need a follow up appointment in 3 months. You may see Dr. Mariah Milling or Eula Listen, PA-C

## 2018-12-05 ENCOUNTER — Telehealth: Payer: Self-pay

## 2018-12-05 NOTE — Telephone Encounter (Signed)
Call attempted to reschedule 01/03/2019 face to face visit with Dr. Mariah Milling to a telephone or video visit due to current clinic policies related to COVID 19 precautions. Left message requesting for patient to call back to discuss.

## 2018-12-21 ENCOUNTER — Telehealth: Payer: Self-pay

## 2018-12-21 NOTE — Telephone Encounter (Signed)
Call attempted to reschedule 01/03/2019 face to face visit with Dr. Mariah Milling to an evisit due to current clinic policies related to COVID 19 precautions-Line busy.

## 2018-12-29 NOTE — Telephone Encounter (Signed)
Called patient.  Someone other than the patient answered the phone.  I made them aware that we needed patient to give [place] a call when he can.  She stated that his PCP stated he didn't need to have the Stress test done so he did not have it done.  Made them aware I need him to call me back and confirm his upcoming appointment.

## 2019-01-02 NOTE — Telephone Encounter (Signed)
Called patient.  No answer. LMOV x3. Patient needs to be changed to an Evisit.  Taking patient off the schedule until he returns call.

## 2019-01-02 NOTE — Progress Notes (Deleted)
{Choose 1 Note Type (Telehealth Visit or Telephone Visit):[phone]}   I connected with  [name] on 01/02/19 by a video enabled telemedicine application and verified that I am speaking with the correct person using two identifiers. I discussed the limitations of evaluation and management by telemedicine. The patient expressed understanding and agreed to proceed.   Evaluation Performed:  Follow-up visit  Date:  01/02/2019   ID:  [name] [name], DOB [birth year], MRN [id number]  Patient Location:  [address] [place] [address]   Provider location:   [facility], [place] office  PCP:  Nicholaus Corolla, MD  Cardiologist:  Hubbard Robinson [facility]   Chief Complaint:      History of Present Illness:    [name] [name] is a 85 y.o. male who presents via audio/video conferencing for a telehealth visit today.   The patient does not symptoms concerning for COVID-19 infection (fever, chills, cough, or new SHORTNESS OF BREATH).   Patient has a past medical history of coronary artery disease,  bypass surgery in 2013 with 3 vessel bypass,  diabetes with most recent hemoglobin A1c 6.9,  hypertension,  He has a history of sleep apnea but does not use CPAP He had a LIMA to the LAD, vein graft to the distal LAD, PDA and OM who presents for routine follow-up of his coronary artery disease   In follow-up, he continues to eat well, weight is down Overall feels well, denies shortness of breath Not smoking Has not required any nitroglycerin Reports his diabetes numbers are improving with weight loss  Total chol 138, LDL 60  No regular exercise program Previously stopped Crestor on his own Did not want to restart a statin in follow-up  EKG on today's visit shows normal sinus rhythm with rate 63 bpm, left axis deviation, unable to exclude old anterior MI  Other past medical history reviewed Cardiac catheterization performed 02/09/2012. This showed 40% ostial LAD  disease, 60% followed by 90% proximal LAD disease, 90% distal LAD disease, 60 followed by 70% ostial and proximal left circumflex disease, 30% OM 2 disease, 80% proximal RCA disease  Lab work  March 2015 shows hemoglobin A1c 8.3, creatinine 1.57  father died of cancer at age 45, mother died of stroke 25    Prior CV studies:   The following studies were reviewed today:    Past Medical History:  Diagnosis Date  . CAD (coronary artery disease)   . Diabetes mellitus (HCC)   . Gout   . HTN (hypertension)   . Hyperlipidemia   . S/P CABG x 4 03/11/2012   LIMA to mid LAD, SVG to distal LAD, SVG to OM, SVG to PDA, EVH via right thigh and leg  . Shortness of breath    With ambulation  . Sleep apnea    Does not wear machine   Past Surgical History:  Procedure Laterality Date  . APPENDECTOMY    . BLADDER SURGERY    . CARDIAC CATHETERIZATION  2013   [facility]  . CATARACT EXTRACTION W/PHACO Left 07/07/2017   Procedure: CATARACT EXTRACTION PHACO AND INTRAOCULAR LENS PLACEMENT (IOC) LEFT DIABETIC;  Surgeon: Lockie Mola, MD;  Location: [facility];  Service: Ophthalmology;  Laterality: Left;  diabetic - oral meds  . COLONOSCOPY    . CORONARY ARTERY BYPASS GRAFT  03/11/2012   Procedure: CORONARY ARTERY BYPASS GRAFTING (CABG);  Surgeon: Purcell Nails, MD;  Location: [facility] OR;  Service: Open Heart Surgery;  Laterality: N/A;  No outpatient medications have been marked as taking for the 01/03/19 encounter (Appointment) with Antonieta IbaGollan, Kinnedy Mongiello J, MD.     Allergies:   Patient has no known allergies.   Social History   Tobacco Use  . Smoking status: Never Smoker  . Smokeless tobacco: Never Used  Substance Use Topics  . Alcohol use: Yes    Comment: rare  . Drug use: No     Current Outpatient Medications on File Prior to Visit  Medication Sig Dispense Refill  . Ascorbic Acid (VITAMIN C) 1000 MG tablet Take 1,000 mg by mouth daily.    Marland Kitchen. aspirin EC 81 MG tablet Take 1 tablet  (81 mg total) by mouth daily. 90 tablet 3  . cholecalciferol (VITAMIN D) 1000 units tablet Take 1,000 Units by mouth daily.    . Cinnamon 500 MG capsule Take 1,000 mg by mouth daily.    Marland Kitchen. glipiZIDE (GLUCOTROL XL) 10 MG 24 hr tablet Take 10 mg by mouth 2 (two) times daily.     Marland Kitchen. linagliptin (TRADJENTA) 5 MG TABS tablet Take 5 mg by mouth daily.    Marland Kitchen. losartan (COZAAR) 100 MG tablet Take 100 mg by mouth daily.     . metFORMIN (GLUCOPHAGE) 500 MG tablet Take 500 mg by mouth daily.    . metoprolol tartrate (LOPRESSOR) 25 MG tablet Take 1 tablet (25 mg total) by mouth 2 (two) times daily. 60 tablet 1  . Multiple Vitamin (MULTIVITAMIN WITH MINERALS) TABS Take 1 tablet by mouth every other day.    . pentoxifylline (TRENTAL) 400 MG CR tablet Take 400 mg by mouth 2 (two) times daily.     Marland Kitchen. zinc gluconate 50 MG tablet Take 50 mg by mouth every other day.     No current facility-administered medications on file prior to visit.      Family Hx: The patient's family history includes Cancer in his father; Stroke in his mother.  ROS:   Please see the history of present illness.    ROS    Labs/Other Tests and Data Reviewed:    Recent Labs: No results found for requested labs within last 8760 hours.   Recent Lipid Panel No results found for: CHOL, TRIG, HDL, CHOLHDL, LDLCALC, LDLDIRECT  Wt Readings from Last 3 Encounters:  10/04/18 181 lb (82.1 kg)  04/26/18 179 lb 14.4 oz (81.6 kg)  07/07/17 203 lb (92.1 kg)     Exam:    Vital Signs: Vital signs may also be detailed in the HPI There were no vitals taken for this visit.  Wt Readings from Last 3 Encounters:  10/04/18 181 lb (82.1 kg)  04/26/18 179 lb 14.4 oz (81.6 kg)  07/07/17 203 lb (92.1 kg)   Temp Readings from Last 3 Encounters:  07/07/17 97.9 F (36.6 C)  03/16/12 98.7 F (37.1 C) (Oral)  03/07/12 97.8 F (36.6 C) (Oral)   BP Readings from Last 3 Encounters:  10/04/18 120/60  04/26/18 (!) 155/71  07/07/17 119/64   Pulse  Readings from Last 3 Encounters:  10/04/18 62  04/26/18 67  07/07/17 62     Well nourished, well developed male in no acute distress. Constitutional:  oriented to person, place, and time. No distress.  Head: Normocephalic and atraumatic.  Eyes:  no discharge. No scleral icterus.  Neck: Normal range of motion. Neck supple.  Pulmonary/Chest: No audible wheezing, no distress, appears comfortable Musculoskeletal: Normal range of motion.  no  tenderness or deformity.  Neurological:   Coordination normal. Full  exam not performed Skin:  No rash Psychiatric:  normal mood and affect. behavior is normal. Thought content normal.    ASSESSMENT & PLAN:    No diagnosis found.   COVID-19 Education: The signs and symptoms of COVID-19 were discussed with the patient and how to seek care for testing (follow up with PCP or arrange E-visit).  The importance of social distancing was discussed today.  Patient Risk:   After full review of this patients clinical status, I feel that they are at least moderate risk at this time.  Time:   Today, I have spent 25 minutes with the patient with telehealth technology discussing the cardiac and medical problems/diagnoses detailed above   10 min spent reviewing the chart prior to patient visit today   Medication Adjustments/Labs and Tests Ordered: Current medicines are reviewed at length with the patient today.  Concerns regarding medicines are outlined above.   Tests Ordered: No tests ordered   Medication Changes: No changes made   Disposition: Follow-up in 6 months   Signed, Julien Nordmann, MD  01/02/2019 8:56 AM    [facility] [address], [place], [place] [address]

## 2019-01-03 ENCOUNTER — Ambulatory Visit: Payer: Medicare Other | Admitting: Cardiovascular Disease

## 2019-05-11 ENCOUNTER — Other Ambulatory Visit: Payer: Medicare Other | Admitting: Urology

## 2019-05-31 ENCOUNTER — Ambulatory Visit: Payer: Medicare Other | Admitting: Urology

## 2019-05-31 ENCOUNTER — Other Ambulatory Visit: Payer: Self-pay

## 2019-05-31 ENCOUNTER — Encounter: Payer: Self-pay | Admitting: Urology

## 2019-05-31 VITALS — BP 147/77 | HR 76 | Ht 72.0 in | Wt 180.0 lb

## 2019-05-31 DIAGNOSIS — Z8551 Personal history of malignant neoplasm of bladder: Secondary | ICD-10-CM

## 2019-05-31 NOTE — Progress Notes (Signed)
Bladder cancer surveillance note  INDICATION Hx of LG Ta UCC  UROLOGIC HISTORY [name] [name] is a 85 y.o. male with history of TURBT July 2015 with Dr. 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  Mitomycin-C was instilled postoperatively.  He has had no recurrences since that time.  Initial Diagnosis of Bladder Year: 02/2014 Pathology: LG Ta  Recurrent Bladder Cancer Diagnosis/= None  Treatments for Bladder Cancer TURBT 02/2014  AUA Risk Category Low risk  Cystoscopy Procedure Note:  After informed consent and discussion of the procedure and its risks, [name] [name] was positioned and prepped in the standard fashion. Cystoscopy was performed with the a flexible cystoscope. The urethra, bladder neck and entire bladder was visualized in a standard fashion, and mucosa was grossly normal throughout. The ureteral orifices were visualized in their normal location and orientation.   Plan  We discussed AUA guidelines that do recommend considering discontinuing cysto screening after 5 years. He would like to repeat cysto in 18 months, and consider discontinuing surveillance if negative at that time  RTC 18 months cysto

## 2019-06-01 LAB — URINALYSIS, COMPLETE
Bilirubin, UA: NEGATIVE
Glucose, UA: NEGATIVE
Ketones, UA: NEGATIVE
Leukocytes,UA: NEGATIVE
Nitrite, UA: NEGATIVE
Protein,UA: NEGATIVE
RBC, UA: NEGATIVE
Specific Gravity, UA: 1.02 (ref 1.005–1.030)
Urobilinogen, Ur: 0.2 mg/dL (ref 0.2–1.0)
pH, UA: 5 (ref 5.0–7.5)

## 2019-06-01 LAB — MICROSCOPIC EXAMINATION
Bacteria, UA: NONE SEEN
Epithelial Cells (non renal): NONE SEEN /hpf (ref 0–10)
RBC: NONE SEEN /hpf (ref 0–2)

## 2020-01-07 NOTE — Progress Notes (Signed)
Cardiology Office Note  Date:  01/09/2020   ID:  [name] [name], DOB 1932/05/18, MRN [id number]  PCP:  Nicholaus Corolla, MD   Chief Complaint  Patient presents with  . OTHER    OD 3 month f/u LS 10/04/18 no complaints today. Meds reviewed verbally with pt.    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  PSH:    Past Surgical History:  Procedure Laterality Date  .  APPENDECTOMY    . BLADDER SURGERY    . CARDIAC CATHETERIZATION  2013   [facility]  . CATARACT EXTRACTION W/PHACO Left 07/07/2017   Procedure: CATARACT EXTRACTION PHACO AND INTRAOCULAR LENS PLACEMENT (IOC) LEFT DIABETIC;  Surgeon: Lockie Mola, MD;  Location: [facility];  Service: Ophthalmology;  Laterality: Left;  diabetic - oral meds  . COLONOSCOPY    . CORONARY ARTERY BYPASS GRAFT  03/11/2012   Procedure: CORONARY ARTERY BYPASS GRAFTING (CABG);  Surgeon: Purcell Nails, MD;  Location: [facility] OR;  Service: Open Heart Surgery;  Laterality: N/A;    Current Outpatient Medications  Medication Sig Dispense Refill  . Ascorbic Acid (VITAMIN C) 500 MG CHEW Chew by mouth daily.    Marland Kitchen glipiZIDE (GLUCOTROL XL) 10 MG 24 hr tablet Take 10 mg by mouth 2 (two) times daily.     Marland Kitchen linagliptin (TRADJENTA) 5 MG TABS tablet Take 5 mg by mouth daily.    Marland Kitchen losartan (COZAAR) 100 MG tablet Take 100 mg by mouth daily.     . metoprolol tartrate (LOPRESSOR) 25 MG tablet Take 1 tablet (25 mg total) by mouth 2 (two) times daily. 60 tablet 1  . Multiple Vitamin (MULTIVITAMIN WITH MINERALS) TABS Take 1 tablet by mouth every other day.    . pentoxifylline (TRENTAL) 400 MG CR tablet Take 400 mg by mouth 2 (two) times daily.     Marland Kitchen VITAMIN D PO Take by mouth daily.    Marland Kitchen  zinc gluconate 50 MG tablet Take 50 mg by mouth every other day.    Marland Kitchen aspirin EC 81 MG tablet Take 1 tablet (81 mg total) by mouth daily. (Patient not taking: Reported on 01/09/2020) 90 tablet 3   No current facility-administered medications for this visit.     Allergies:   Patient has no known allergies.   Social History:  The patient  reports that he has never smoked. He has never used smokeless tobacco. He reports current alcohol use. He reports that he does not use drugs.   Family History:   family history includes Cancer in his father; Stroke in his mother.    Review of Systems: Review of Systems  Constitutional: Negative.    Respiratory: Negative.   Cardiovascular: Negative.   Gastrointestinal: Negative.   Musculoskeletal: Negative.   Neurological: Negative.   Psychiatric/Behavioral: Negative.   All other systems reviewed and are negative.    PHYSICAL EXAM: VS:  BP 138/70 (BP Location: Left Arm, Patient Position: Sitting, Cuff Size: Normal)   Pulse 62   Ht 5\' 11"  (1.803 m)   Wt 187 lb 4 oz (84.9 kg)   SpO2 98%   BMI 26.12 kg/m  , BMI Body mass index is 26.12 kg/m. Constitutional:  oriented to person, place, and time. No distress.  HENT:  Head: Grossly normal Eyes:  no discharge. No scleral icterus.  Neck: No JVD, no carotid bruits  Cardiovascular: Regular rate and rhythm, no murmurs appreciated Pulmonary/Chest: Clear to auscultation bilaterally, no wheezes or rails Abdominal: Soft.  no distension.  no tenderness.  Musculoskeletal: Normal range of motion Neurological:  normal muscle tone. Coordination normal. No atrophy Skin: Skin warm and dry Psychiatric: normal affect, pleasant   Recent Labs: No results found for requested labs within last 8760 hours.    Lipid Panel No results found for: CHOL, HDL, LDLCALC, TRIG    Wt Readings from Last 3 Encounters:  01/09/20 187 lb 4 oz (84.9 kg)  05/31/19 180 lb (81.6 kg)  10/04/18 181 lb (82.1 kg)     ASSESSMENT AND PLAN:  Essential hypertension - Plan: EKG 12-Lead Blood pressure is well controlled on today's visit. No changes made to the medications.  Mixed hyperlipidemia Took himself off Crestor, does not want to restart Will try to obtain recent lab work from primary care  Coronary artery disease involving native coronary artery of native heart without angina pectoris Currently with no symptoms of angina. No further workup at this time. Continue current medication regimen.  Type 2 diabetes mellitus with other circulatory complication, without long-term current use of insulin ([place]) We have encouraged continued exercise, careful diet  management in an effort to lose weight.  S/P CABG x 4 Denies anginal symptoms Declining statin Continue beta-blocker, ARB  Disposition:   F/U  12 months   Total encounter time more than 25 minutes  Greater than 50% was spent in counseling and coordination of care with the patient    No orders of the defined types were placed in this encounter.    Signed, Esmond Plants, M.D., Ph.D. 01/09/2020  [place], [place]

## 2020-01-09 ENCOUNTER — Ambulatory Visit (INDEPENDENT_AMBULATORY_CARE_PROVIDER_SITE_OTHER): Payer: Medicare Other | Admitting: Cardiovascular Disease

## 2020-01-09 ENCOUNTER — Encounter: Payer: Self-pay | Admitting: Cardiovascular Disease

## 2020-01-09 ENCOUNTER — Other Ambulatory Visit: Payer: Self-pay

## 2020-01-09 VITALS — BP 138/70 | HR 62 | Ht 71.0 in | Wt 187.2 lb

## 2020-01-09 DIAGNOSIS — G4730 Sleep apnea, unspecified: Secondary | ICD-10-CM

## 2020-01-09 DIAGNOSIS — I10 Essential (primary) hypertension: Secondary | ICD-10-CM

## 2020-01-09 DIAGNOSIS — I25118 Atherosclerotic heart disease of native coronary artery with other forms of angina pectoris: Secondary | ICD-10-CM

## 2020-01-09 DIAGNOSIS — E785 Hyperlipidemia, unspecified: Secondary | ICD-10-CM | POA: Diagnosis not present

## 2020-01-09 DIAGNOSIS — R079 Chest pain, unspecified: Secondary | ICD-10-CM | POA: Diagnosis not present

## 2020-01-09 NOTE — Patient Instructions (Signed)
We will try to get the labs from PMD, Dr. Nicholaus Corolla    Medication Instructions:  No changes  If you need a refill on your cardiac medications before your next appointment, please call your pharmacy.    Lab work: No new labs needed   If you have labs (blood work) drawn today and your tests are completely normal, you will receive your results only by: Marland Kitchen MyChart Message (if you have MyChart) OR . A paper copy in the mail If you have any lab test that is abnormal or we need to change your treatment, we will call you to review the results.   Testing/Procedures: No new testing needed   Follow-Up: At [facility], you and your health needs are our priority.  As part of our continuing mission to provide you with exceptional heart care, we have created designated Provider Care Teams.  These Care Teams include your primary Cardiologist (physician) and Advanced Practice Providers (APPs -  Physician Assistants and Nurse Practitioners) who all work together to provide you with the care you need, when you need it.  . You will need a follow up appointment in 12 months   . Providers on your designated Care Team:   . Nicolasa Ducking, NP . Eula Listen, PA-C . Marisue Ivan, PA-C  Any Other Special Instructions Will Be Listed Below (If Applicable).  For educational health videos Log in to : [email or web address] Or : [email or web address], password : [email or web address]

## 2020-11-28 ENCOUNTER — Other Ambulatory Visit: Payer: Self-pay

## 2020-11-28 ENCOUNTER — Encounter: Payer: Self-pay | Admitting: Urology

## 2020-11-28 ENCOUNTER — Ambulatory Visit: Payer: Medicare Other | Admitting: Urology

## 2020-11-28 VITALS — BP 138/68 | HR 71 | Ht 71.0 in | Wt 175.0 lb

## 2020-11-28 DIAGNOSIS — Z8551 Personal history of malignant neoplasm of bladder: Secondary | ICD-10-CM

## 2020-11-28 NOTE — Progress Notes (Signed)
Bladder cancer surveillance note  INDICATION Hx of LG Ta UCC  UROLOGIC HISTORY [name] Mcmanaway Dickeyis a 87 y.o.malewith history of TURBT July 2015 with Dr. 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 Mitomycin-C was instilled postoperatively. He has had no recurrences since that time.  Initial Diagnosis of Bladder Year:02/2014 Pathology:LG Ta  Recurrent Bladder Cancer Diagnosis/= None  Treatments for Bladder Cancer TURBT 02/2014  AUA Risk Category Low risk  Cystoscopy Procedure Note:  After informed consent and discussion of the procedure and its risks,[name] R Dickeywas positioned and prepped in the standard fashion. Cystoscopy was performed with the a flexible cystoscope.  Very large prostate with high bladder neck.  The urethra, bladder neck and entire bladder was visualized in a standard fashion, and mucosa was grossly normal throughout. The ureteral orifices were visualized in their normal location and orientation.  Plan  We discussed AUA guidelines that do recommend considering discontinuing cysto screening after 5 years. He would like to continue screening cystoscopy other year.  Return precautions discussed  RTC 2 years for cystoscopy  Legrand Rams, MD 11/28/2020

## 2020-12-02 LAB — URINALYSIS, COMPLETE
Bilirubin, UA: NEGATIVE
Glucose, UA: NEGATIVE
Leukocytes,UA: NEGATIVE
Nitrite, UA: NEGATIVE
Protein,UA: NEGATIVE
RBC, UA: NEGATIVE
Specific Gravity, UA: 1.02 (ref 1.005–1.030)
Urobilinogen, Ur: 1 mg/dL (ref 0.2–1.0)
pH, UA: 5 (ref 5.0–7.5)

## 2020-12-02 LAB — MICROSCOPIC EXAMINATION
Bacteria, UA: NONE SEEN
Epithelial Cells (non renal): NONE SEEN /hpf (ref 0–10)

## 2020-12-12 ENCOUNTER — Ambulatory Visit (INDEPENDENT_AMBULATORY_CARE_PROVIDER_SITE_OTHER): Payer: Medicare Other

## 2020-12-12 ENCOUNTER — Ambulatory Visit: Payer: Medicare Other | Admitting: Family

## 2020-12-12 ENCOUNTER — Telehealth: Payer: Self-pay | Admitting: Family

## 2020-12-12 ENCOUNTER — Encounter: Payer: Self-pay | Admitting: Family

## 2020-12-12 ENCOUNTER — Other Ambulatory Visit: Payer: Self-pay

## 2020-12-12 VITALS — BP 169/67 | HR 52 | Ht 71.0 in | Wt 186.8 lb

## 2020-12-12 DIAGNOSIS — E782 Mixed hyperlipidemia: Secondary | ICD-10-CM

## 2020-12-12 DIAGNOSIS — I4891 Unspecified atrial fibrillation: Secondary | ICD-10-CM | POA: Diagnosis not present

## 2020-12-12 DIAGNOSIS — R55 Syncope and collapse: Secondary | ICD-10-CM

## 2020-12-12 DIAGNOSIS — I10 Essential (primary) hypertension: Secondary | ICD-10-CM

## 2020-12-12 DIAGNOSIS — I25118 Atherosclerotic heart disease of native coronary artery with other forms of angina pectoris: Secondary | ICD-10-CM

## 2020-12-12 NOTE — Telephone Encounter (Signed)
Spoke to [place] with [employer] re pt ZioAT monitor that was placed today in office.  Erie Noe reports: A fib with HR between 60-74 bpm, average HR 65 bpm that lasted 90 seconds. Episode noted at 1041. Pt asymptomatic.  Erie Noe was able to reach pt and he confirmed no symptoms.  Provider made aware in office. Forwarding note.

## 2020-12-12 NOTE — Telephone Encounter (Signed)
Known atrial fibrillation based on EKG in clinic. Continue with plan of care as discussed in clinic. No changes at this time.   Alver Sorrow, NP

## 2020-12-12 NOTE — Telephone Encounter (Signed)
Patients sister Adele Barthel called in to give her cell phone number,[phone]. Sister states Gillian Shields requested it

## 2020-12-12 NOTE — Patient Instructions (Addendum)
Medication Instructions:  Your physician has recommended you make the following change in your medication:   STOP Metoprolol   *If you need a refill on your cardiac medications before your next appointment, please call your pharmacy*   Lab Work: Your physician recommends that you have lab work TODAY: CMET, CBC, TSH  If you have labs (blood work) drawn today and your tests are completely normal, you will receive your results only by: Marland Kitchen MyChart Message (if you have MyChart) OR . A paper copy in the mail If you have any lab test that is abnormal or we need to change your treatment, we will call you to review the results.   Testing/Procedures:   1) Your physician has requested that you have an echocardiogram. (May be done after next follow up appointment if unable to schedule prior). Echocardiography is a painless test that uses sound waves to create images of your heart. It provides your doctor with information about the size and shape of your heart and how well your heart's chambers and valves are working. This procedure takes approximately one hour. There are no restrictions for this procedure.  2) Your physician has requested that you have a carotid duplex.(May be done after next follow up appointment if unable to schedule prior). This test is an ultrasound of the carotid arteries in your neck. It looks at blood flow through these arteries that supply the brain with blood. Allow one hour for this exam. There are no restrictions or special instructions.    3) Your physician has recommended that you wear an LIVE Zio (AT) monitor for 2 weeks.    This monitor is a medical device that records the heart's electrical activity. Doctors most often use these monitors to diagnose arrhythmias. Arrhythmias are problems with the speed or rhythm of the heartbeat. The monitor is a small device applied to your chest. You can wear one while you do your normal daily activities. While wearing this monitor if  you have any symptoms to push the button and record what you felt. Once you have worn this monitor for the period of time provider prescribed (Usually 14 days), you will return the monitor device in the postage paid box. Once it is returned they will download the data collected and provide [place] with a report which the provider will then review and we will call you with those results. Important tips:  1. Avoid showering during the first 24 hours of wearing the monitor. 2. Avoid excessive sweating to help maximize wear time. 3. Do not submerge the device, no hot tubs, and no swimming pools. 4. Keep any lotions or oils away from the patch. 5. After 24 hours you may shower with the patch on. Take brief showers with your back facing the shower head.  6. Do not remove patch once it has been placed because that will interrupt data and decrease adhesive wear time. 7. Push the button when you have any symptoms and write down what you were feeling. 8. Once you have completed wearing your monitor, remove and place into box which has postage paid and place in your outgoing mailbox.  9. If for some reason you have misplaced your box then call our office and we can provide another box and/or mail it off for you.    Follow-Up: At [facility], you and your health needs are our priority.  As part of our continuing mission to provide you with exceptional heart care, we have created designated Provider Care Teams.  These Care Teams include your primary Cardiologist (physician) and Advanced Practice Providers (APPs -  Physician Assistants and Nurse Practitioners) who all work together to provide you with the care you need, when you need it.  We recommend signing up for the patient portal called "MyChart".  Sign up information is provided on this After Visit Summary.  MyChart is used to connect with patients for Virtual Visits (Telemedicine).  Patients are able to view lab/test results, encounter notes, upcoming  appointments, etc.  Non-urgent messages can be sent to your provider as well.   To learn more about what you can do with MyChart, go to [email or web address].    Your next appointment:   3 week(s)  The format for your next appointment:   In Person  Provider:   You may see Julien Nordmann, MD or one of the following Advanced Practice Providers on your designated Care Team:     Gillian Shields, NP    Other Instructions    Atrial Fibrillation  Atrial fibrillation is a type of heartbeat that is irregular or fast. If you have this condition, your heart beats without any order. This makes it hard for your heart to pump blood in a normal way. Atrial fibrillation may come and go, or it may become a long-lasting problem. If this condition is not treated, it can put you at higher risk for stroke, heart failure, and other heart problems. What are the causes? This condition may be caused by diseases that damage the heart. They include:  High blood pressure.  Heart failure.  Heart valve disease.  Heart surgery. Other causes include:  Diabetes.  Thyroid disease.  Being overweight.  Kidney disease. Sometimes the cause is not known. What increases the risk? You are more likely to develop this condition if:  You are older.  You smoke.  You exercise often and very hard.  You have a family history of this condition.  You are a man.  You use drugs.  You drink a lot of alcohol.  You have lung conditions, such as emphysema, pneumonia, or COPD.  You have sleep apnea. What are the signs or symptoms? Common symptoms of this condition include:  A feeling that your heart is beating very fast.  Chest pain or discomfort.  Feeling short of breath.  Suddenly feeling light-headed or weak.  Getting tired easily during activity.  Fainting.  Sweating. In some cases, there are no symptoms. How is this treated? Treatment for this condition depends on underlying  conditions and how you feel when you have atrial fibrillation. They include:  Medicines to: ? Prevent blood clots. ? Treat heart rate or heart rhythm problems.  Using devices, such as a pacemaker, to correct heart rhythm problems.  Doing surgery to remove the part of the heart that sends bad signals.  Closing an area where clots can form in the heart (left atrial appendage). In some cases, your doctor will treat other underlying conditions. Follow these instructions at home: Medicines  Take over-the-counter and prescription medicines only as told by your doctor.  Do not take any new medicines without first talking to your doctor.  If you are taking blood thinners: ? Talk with your doctor before you take any medicines that have aspirin or NSAIDs, such as ibuprofen, in them. ? Take your medicine exactly as told by your doctor. Take it at the same time each day. ? Avoid activities that could hurt or bruise you. Follow instructions about how to prevent falls. ? Wear  a bracelet that says you are taking blood thinners. Or, carry a card that lists what medicines you take. Lifestyle  Do not use any products that have nicotine or tobacco in them. These include cigarettes, e-cigarettes, and chewing tobacco. If you need help quitting, ask your doctor.  Eat heart-healthy foods. Talk with your doctor about the right eating plan for you.  Exercise regularly as told by your doctor.  Do not drink alcohol.  Lose weight if you are overweight.  Do not use drugs, including cannabis.      General instructions  If you have a condition that causes breathing to stop for a short period of time (apnea), treat it as told by your doctor.  Keep a healthy weight. Do not use diet pills unless your doctor says they are safe for you. Diet pills may make heart problems worse.  Keep all follow-up visits as told by your doctor. This is important. Contact a doctor if:  You notice a change in the speed,  rhythm, or strength of your heartbeat.  You are taking a blood-thinning medicine and you get more bruising.  You get tired more easily when you move or exercise.  You have a sudden change in weight. Get help right away if:  You have pain in your chest or your belly (abdomen).  You have trouble breathing.  You have side effects of blood thinners, such as blood in your vomit, poop (stool), or pee (urine), or bleeding that cannot stop.  You have any signs of a stroke. "BE FAST" is an easy way to remember the main warning signs: ? B - Balance. Signs are dizziness, sudden trouble walking, or loss of balance. ? E - Eyes. Signs are trouble seeing or a change in how you see. ? F - Face. Signs are sudden weakness or loss of feeling in the face, or the face or eyelid drooping on one side. ? A - Arms. Signs are weakness or loss of feeling in an arm. This happens suddenly and usually on one side of the body. ? S - Speech. Signs are sudden trouble speaking, slurred speech, or trouble understanding what people say. ? T - Time. Time to call emergency services. Write down what time symptoms started.  You have other signs of a stroke, such as: ? A sudden, very bad headache with no known cause. ? Feeling like you may vomit (nausea). ? Vomiting. ? A seizure. These symptoms may be an emergency. Do not wait to see if the symptoms will go away. Get medical help right away. Call your local emergency services (911 in the [place] Do not drive yourself to the hospital.   Summary  Atrial fibrillation is a type of heartbeat that is irregular or fast.  You are at higher risk of this condition if you smoke, are older, have diabetes, or are overweight.  Follow your doctor's instructions about medicines, diet, exercise, and follow-up visits.  Get help right away if you have signs or symptoms of a stroke.  Get help right away if you cannot catch your breath, or you have chest pain or discomfort. This  information is not intended to replace advice given to you by your health care provider. Make sure you discuss any questions you have with your health care provider. Document Revised: 01/25/2019 Document Reviewed: 01/25/2019 Elsevier Patient Education  2021 [employer].

## 2020-12-12 NOTE — Progress Notes (Signed)
Office Visit    Patient Name: [name] [name] Date of Encounter: 12/12/2020  PCP:  Nicholaus Corolla, MD   [facility]  Cardiologist:  Julien Nordmann, MD  Advanced Practice Provider:  No care team member to display Electrophysiologist:  None   Chief Complaint    [name] [name] is a 87 y.o. male with a hx of CAD s/p CABG 2013 (LIMA-LAD, SVG-LAD/PDA/OM), DM2, HTN, HLD, sleep apnea not on CPAP presents today for syncope and possible atrial fibrillation  Past Medical History    Past Medical History:  Diagnosis Date  . CAD (coronary artery disease)   . Diabetes mellitus (HCC)   . Gout   . HTN (hypertension)   . Hyperlipidemia   . S/P CABG x 4 03/11/2012   LIMA to mid LAD, SVG to distal LAD, SVG to OM, SVG to PDA, EVH via right thigh and leg  . Shortness of breath    With ambulation  . Sleep apnea    Does not wear machine   Past Surgical History:  Procedure Laterality Date  . APPENDECTOMY    . BLADDER SURGERY    . CARDIAC CATHETERIZATION  2013   [facility]  . CATARACT EXTRACTION W/PHACO Left 07/07/2017   Procedure: CATARACT EXTRACTION PHACO AND INTRAOCULAR LENS PLACEMENT (IOC) LEFT DIABETIC;  Surgeon: Lockie Mola, MD;  Location: [facility];  Service: Ophthalmology;  Laterality: Left;  diabetic - oral meds  . COLONOSCOPY    . CORONARY ARTERY BYPASS GRAFT  03/11/2012   Procedure: CORONARY ARTERY BYPASS GRAFTING (CABG);  Surgeon: Purcell Nails, MD;  Location: [facility] OR;  Service: Open Heart Surgery;  Laterality: N/A;    Allergies  No Known Allergies  History of Present Illness    [name] [name] is a 87 y.o. male with a hx of CADx5 s/p CABG 2013 (LIMA-LAD, SVG-LAD/PDA/OM), DM2, HTN, HLD, sleep apnea not on CPAP last seen 01/09/2020 by Dr. Mariah Milling.  Previous cardiac bypass in 2013. He has not been maintained on statin due to patient preference. He has also declined CPAP. When last seen he was feeling well from a cardiac perspective, reported no  anginal symptoms, and declined statin. His beta blocker, aspirin, and ARB were continued.   Presents today after phone call from primary care noting new onset atrial fibrillation and syncopal episodes. Their notes and testing are not available to me today. He notes lightheadedness and "falling episodes". Tells me he has passed out three or four times over the last month. [place] once in the woods and then in his home. He thinks his loss of consciousness for only "a couple minutes" but episodes were unwittnessed. Denies injuries.  Lives alone and still drives. Tells me he is very active and uses heavy machinery such as chain saws, was encouraged to hold off on such activities until cardiac workup completed. No chest pain, pressure, tightness. No shortness of breath nor dyspnea on exertion. No palpitations recently. Tells me once over the winter months he was shoveling snow and noticed some fast heart rates.   EKGs/Labs/Other Studies Reviewed:   The following studies were reviewed today:  EKG:  EKG is  ordered today.  The ekg ordered today demonstrates atrial fibrillation with slow ventricular response 52 bpm with occasional PVC.  No acute ST/T wave changes.  Rhythm strip with heart rate 52 to 56 bpm with no significant.  Recent Labs: No results found for requested labs within last 8760 hours.  Recent Lipid Panel No results  found for: CHOL, TRIG, HDL, CHOLHDL, VLDL, LDLCALC, LDLDIRECT  Home Medications   Current Meds  Medication Sig  . amLODipine (NORVASC) 5 MG tablet Take 5 mg by mouth daily.  . Ascorbic Acid (VITAMIN C) 500 MG CHEW Chew by mouth daily.  Marland Kitchen aspirin EC 81 MG tablet Take 81 mg by mouth daily. Swallow whole.  . Cholecalciferol (VITAMIN D3) 25 MCG (1000 UT) CAPS Take by mouth daily.  Marland Kitchen glipiZIDE (GLUCOTROL XL) 10 MG 24 hr tablet Take 10 mg by mouth 2 (two) times daily.   Marland Kitchen losartan (COZAAR) 100 MG tablet Take 100 mg by mouth daily.   . Melatonin 10 MG TABS Take 1-2 tablets by mouth  at bedtime.  . metoprolol tartrate (LOPRESSOR) 25 MG tablet Take 12.5 mg by mouth 2 (two) times daily.  . Multiple Vitamins-Minerals (PRESERVISION AREDS 2 PO) Take 1 tablet by mouth every other day.  . zinc gluconate 50 MG tablet Take 50 mg by mouth every other day.     Review of Systems  All other systems reviewed and are otherwise negative except as noted above.  Physical Exam    VS:  BP (!) 169/67   Pulse (!) 52   Ht 5\' 11"  (1.803 m)   Wt 186 lb 12.8 oz (84.7 kg)   BMI 26.05 kg/m  , BMI Body mass index is 26.05 kg/m.  Wt Readings from Last 3 Encounters:  12/12/20 186 lb 12.8 oz (84.7 kg)  11/28/20 175 lb (79.4 kg)  01/09/20 187 lb 4 oz (84.9 kg)    GEN: Well nourished, well developed, in no acute distress. HEENT: normal. Neck: Supple, no JVD, carotid bruits, or masses. Cardiac: IRIR, bradycardic, no murmurs, rubs, or gallops. No clubbing, cyanosis, edema.  Radials/PT 2+ and equal bilaterally.  Respiratory:  Respirations regular and unlabored, clear to auscultation bilaterally. GI: Soft, nontender, nondistended. MS: No deformity or atrophy. Skin: Warm and dry, no rash. Neuro:  Strength and sensation are intact. CN II-VII grossly intact.  Psych: Normal affect.  Assessment & Plan    1. Syncope- Reports 4 episodes of syncope over the last month. 2 week live telemetry monitor placed today. Echocardiogram, carotid duplex ordered. Consider etiology post-conversion pause or bradycardia. Discontinue Metoprolol. Encouraged to stay well hydrated, make position changes slowly. No evidence of orthostatic hypotension today. Recommend not driving per [place] DMV guidelines, avoidance of using heavy machinery.   2. Atrial fibrillation- New onset. Noted bradycardic rate and syncope, discontinue Metoprolol. 2 week live telemetry monitor placed today. CMP, CBC, TSH today. Notes lightheadedness and syncope but no recent palpitations. Concern for significant pause, post-conversion pause, and/or  bradycardic rates as precipitating syncope. Plan for echocardiogram to assess for structural abnormality. CHADS2VASc ofa t least 4 (agex2, CAD, HTN) however due to recurrent falls and bleeding risk discussed with Dr. 01/11/20 (DOD) and we will defer anticoagulation at this time. Discussed signs and symptoms of stroke to report and he verbalized understanding.   3. CAD s/p CABG- No anginal symptoms, no indication for ischemic evaluation. GDMT includes aspirin. Discontinue metoprolol, as above. Declines statin.   4. HTN- BP elevated. In the setting of syncope, hesitant to adjust antihypertensive regimen. No evidence of orthostasis today. Continue current antihypertensive regimen. In follow up consider increased dose of Amlodipine. Avoid AV nodal blocking agents in setting of bradycardia.    5. HLD- Continues to decline statin.   Disposition: Follow up in 3 week(s) with Dr. Kirke Corin or APP  Signed, Mariah Milling, NP 12/12/2020, 9:29  AM [place]

## 2020-12-12 NOTE — Telephone Encounter (Signed)
I rhythm calling to report abnormal zio reading.

## 2020-12-13 ENCOUNTER — Telehealth: Payer: Self-pay | Admitting: *Deleted

## 2020-12-13 DIAGNOSIS — E876 Hypokalemia: Secondary | ICD-10-CM

## 2020-12-13 DIAGNOSIS — I10 Essential (primary) hypertension: Secondary | ICD-10-CM

## 2020-12-13 DIAGNOSIS — R7989 Other specified abnormal findings of blood chemistry: Secondary | ICD-10-CM

## 2020-12-13 DIAGNOSIS — I25118 Atherosclerotic heart disease of native coronary artery with other forms of angina pectoris: Secondary | ICD-10-CM

## 2020-12-13 LAB — CBC
Hematocrit: 39 % (ref 37.5–51.0)
Hemoglobin: 13.2 g/dL (ref 13.0–17.7)
MCH: 30.3 pg (ref 26.6–33.0)
MCHC: 33.8 g/dL (ref 31.5–35.7)
MCV: 90 fL (ref 79–97)
Platelets: 223 10*3/uL (ref 150–450)
RBC: 4.35 x10E6/uL (ref 4.14–5.80)
RDW: 13.2 % (ref 11.6–15.4)
WBC: 9.2 10*3/uL (ref 3.4–10.8)

## 2020-12-13 LAB — COMPREHENSIVE METABOLIC PANEL
ALT: 24 IU/L (ref 0–44)
AST: 26 IU/L (ref 0–40)
Albumin/Globulin Ratio: 2 (ref 1.2–2.2)
Albumin: 4.3 g/dL (ref 3.6–4.6)
Alkaline Phosphatase: 125 IU/L — ABNORMAL HIGH (ref 44–121)
BUN/Creatinine Ratio: 14 (ref 10–24)
BUN: 17 mg/dL (ref 8–27)
Bilirubin Total: 1.4 mg/dL — ABNORMAL HIGH (ref 0.0–1.2)
CO2: 24 mmol/L (ref 20–29)
Calcium: 9.1 mg/dL (ref 8.6–10.2)
Chloride: 101 mmol/L (ref 96–106)
Creatinine, Ser: 1.18 mg/dL (ref 0.76–1.27)
Globulin, Total: 2.1 g/dL (ref 1.5–4.5)
Glucose: 138 mg/dL — ABNORMAL HIGH (ref 65–99)
Potassium: 3.4 mmol/L — ABNORMAL LOW (ref 3.5–5.2)
Sodium: 142 mmol/L (ref 134–144)
Total Protein: 6.4 g/dL (ref 6.0–8.5)
eGFR: 60 mL/min/{1.73_m2} (ref >59)

## 2020-12-13 LAB — TSH: TSH: 4.78 u[IU]/mL — ABNORMAL HIGH (ref 0.450–4.500)

## 2020-12-13 MED ORDER — POTASSIUM CHLORIDE CRYS ER 20 MEQ PO TBCR: 20.0000 meq | EXTENDED_RELEASE_TABLET | Freq: Every day | ORAL | 0 refills | Status: DC

## 2020-12-13 NOTE — Telephone Encounter (Signed)
-----   Message from Alver Sorrow, NP sent at 12/13/2020  9:16 AM EDT ----- CBC with no evidence of anemia nor infection. Normal kidney function. Liver enzymes overall normal with exception of mildly elevated alkaline phosphatase, recommend avoidance of alcohol and fried foods. Potassium mildly low - recommend Potassium x4 days and repeat BMP in 1 week.  TSH elevated - can we please request lab to add on Free T3/Free T4 for further evaluation? He will need to follow up with primary care regarding this.

## 2020-12-13 NOTE — Telephone Encounter (Signed)
Spoke to pt, notified of lab results and provider's recc.  Pt voiced understanding.  He will start Potassium x 4 days and [facility]. He confirms he does not drink alcohol, and will avoid fried foods.  Rx sent to [facility] pharmacy per pt request.  Lab orders placed for BMET.   Called [facility] and had Free T3/Free T4 added on to current labs.  Waiting for confirmation fax from [facility].  Pt aware to follow up with PCP re elevated TSH.

## 2020-12-13 NOTE — Telephone Encounter (Signed)
I have updated ziosuite registration with pt's sister cell number below.

## 2020-12-14 LAB — SPECIMEN STATUS REPORT

## 2020-12-14 LAB — T3, FREE: T3, Free: 3.4 pg/mL (ref 2.0–4.4)

## 2020-12-14 LAB — T4, FREE: Free T4: 1.27 ng/dL (ref 0.82–1.77)

## 2020-12-20 ENCOUNTER — Other Ambulatory Visit
Admission: RE | Admit: 2020-12-20 | Discharge: 2020-12-20 | Disposition: A | Discharge status: Home / Self Care | Payer: Medicare (Managed Care) | Attending: Family | Admitting: Family

## 2020-12-20 ENCOUNTER — Telehealth: Payer: Self-pay | Admitting: *Deleted

## 2020-12-20 ENCOUNTER — Other Ambulatory Visit: Payer: Self-pay

## 2020-12-20 DIAGNOSIS — I25118 Atherosclerotic heart disease of native coronary artery with other forms of angina pectoris: Secondary | ICD-10-CM | POA: Diagnosis present

## 2020-12-20 DIAGNOSIS — I10 Essential (primary) hypertension: Secondary | ICD-10-CM | POA: Diagnosis present

## 2020-12-20 DIAGNOSIS — E876 Hypokalemia: Secondary | ICD-10-CM | POA: Diagnosis present

## 2020-12-20 LAB — BASIC METABOLIC PANEL
Anion gap: 9 (ref 5–15)
BUN: 19 mg/dL (ref 8–23)
CO2: 28 mmol/L (ref 22–32)
Calcium: 9 mg/dL (ref 8.9–10.3)
Chloride: 105 mmol/L (ref 98–111)
Creatinine, Ser: 1.37 mg/dL — ABNORMAL HIGH (ref 0.61–1.24)
GFR, Estimated: 50 mL/min — ABNORMAL LOW (ref >60)
Glucose, Bld: 216 mg/dL — ABNORMAL HIGH (ref 70–99)
Potassium: 3.3 mmol/L — ABNORMAL LOW (ref 3.5–5.1)
Sodium: 142 mmol/L (ref 135–145)

## 2020-12-20 MED ORDER — POTASSIUM CHLORIDE CRYS ER 20 MEQ PO TBCR: 20.0000 meq | EXTENDED_RELEASE_TABLET | Freq: Every day | ORAL | 1 refills | Status: DC

## 2020-12-20 MED ORDER — POTASSIUM CHLORIDE CRYS ER 20 MEQ PO TBCR: EXTENDED_RELEASE_TABLET | ORAL | 0 refills | Status: DC

## 2020-12-20 NOTE — Telephone Encounter (Signed)
Spoke to pt. Notified of lab results and provider's recc.  Pt verbalized understanding. He will take Potassium daily x 2 days THEN Potassium daily.  Rx sent to [facility] pharmacy per pt request.  [facility] will [facility] after his echo that is scheduled 5/17.  Orders placed and updated appt notes to remind pt for lab work after echo.   Pt has no further questions at this time.

## 2020-12-20 NOTE — Telephone Encounter (Signed)
-----   Message from Alver Sorrow, NP sent at 12/20/2020  2:00 PM EDT ----- Stable renal function compared to previous. Potassium remains low. Recommend Potassium daily for 2 days then daily. Repeat BMP on 12/31/20 when he is in clinic for echocardiogram (can be done at [facility] after his echo/carotid duplex).

## 2020-12-31 ENCOUNTER — Ambulatory Visit (INDEPENDENT_AMBULATORY_CARE_PROVIDER_SITE_OTHER): Payer: Medicare (Managed Care)

## 2020-12-31 ENCOUNTER — Other Ambulatory Visit: Payer: Self-pay

## 2020-12-31 ENCOUNTER — Other Ambulatory Visit
Admission: RE | Admit: 2020-12-31 | Discharge: 2020-12-31 | Disposition: A | Discharge status: Home / Self Care | Payer: Medicare (Managed Care) | Source: Ambulatory Visit | Attending: Family | Admitting: Family

## 2020-12-31 DIAGNOSIS — R55 Syncope and collapse: Secondary | ICD-10-CM

## 2020-12-31 DIAGNOSIS — R42 Dizziness and giddiness: Secondary | ICD-10-CM

## 2020-12-31 DIAGNOSIS — R946 Abnormal results of thyroid function studies: Secondary | ICD-10-CM | POA: Insufficient documentation

## 2020-12-31 DIAGNOSIS — E876 Hypokalemia: Secondary | ICD-10-CM | POA: Diagnosis present

## 2020-12-31 DIAGNOSIS — R7989 Other specified abnormal findings of blood chemistry: Secondary | ICD-10-CM

## 2020-12-31 DIAGNOSIS — I4891 Unspecified atrial fibrillation: Secondary | ICD-10-CM | POA: Diagnosis not present

## 2020-12-31 LAB — BASIC METABOLIC PANEL
Anion gap: 8 (ref 5–15)
BUN: 23 mg/dL (ref 8–23)
CO2: 27 mmol/L (ref 22–32)
Calcium: 9.5 mg/dL (ref 8.9–10.3)
Chloride: 103 mmol/L (ref 98–111)
Creatinine, Ser: 1.42 mg/dL — ABNORMAL HIGH (ref 0.61–1.24)
GFR, Estimated: 48 mL/min — ABNORMAL LOW (ref >60)
Glucose, Bld: 163 mg/dL — ABNORMAL HIGH (ref 70–99)
Potassium: 4.5 mmol/L (ref 3.5–5.1)
Sodium: 138 mmol/L (ref 135–145)

## 2020-12-31 LAB — ECHOCARDIOGRAM COMPLETE
AR max vel: 2.81 cm2
AV Area VTI: 2.58 cm2
AV Area mean vel: 2.31 cm2
AV Mean grad: 2 mmHg
AV Peak grad: 4.3 mmHg
Ao pk vel: 1.04 m/s
Area-P 1/2: 4.04 cm2
S' Lateral: 3.5 cm

## 2020-12-31 LAB — T4, FREE: Free T4: 0.96 ng/dL (ref 0.61–1.12)

## 2021-01-01 LAB — T3, FREE: T3, Free: 3.4 pg/mL (ref 2.0–4.4)

## 2021-01-02 ENCOUNTER — Telehealth: Payer: Self-pay | Admitting: *Deleted

## 2021-01-02 NOTE — Telephone Encounter (Addendum)
Attempted to call pt to review lab results. No answer. Lmtcb. See additional result notes to review below:  Alver Sorrow, NP  Annia Belt, RN No significant carotid artery stenosis. Good result.  (Carotid ultrasound)  Alver Sorrow, NP  Annia Belt, RN Cc: Antonieta Iba, MD Normal heart pumping function with no wall motion abnormalities. Abnormnal longitunal strain indicative of some cardiac muscle dysfunction. RVSF moderately reduced suggestive and elevated PASP. Bilateral atria dilated indicating low suspicion sinus rhythm can be restored. Mild to moderate leaky mitral valve. Mild aortic dilation, recommend repeat study in 1 year for monitoring.   No changes at this time. Follow up in clinic as scheduled. If he has excess volume or dyspnea in setting of elevated PASP may consider addition of diuretic. Will CC Dr. Mariah Milling so he is aware for upcoming 01/08/21 appt.  (Echo)  Alver Sorrow, NP  Annia Belt, RN Normal potassium - continue potassium daily. Stable kidney function. T4 remains normal. As previously recommended, follow up with PCP regarding subclinical hypothyroidism. No changes at this time.

## 2021-01-02 NOTE — Telephone Encounter (Signed)
-----   Message from Alver Sorrow, NP sent at 01/01/2021  8:55 AM EDT ----- Normal T3. As previously recommended, follow with PCP regarding subclinical hypothyroidism.

## 2021-01-08 ENCOUNTER — Encounter: Payer: Self-pay | Admitting: Cardiovascular Disease

## 2021-01-08 ENCOUNTER — Ambulatory Visit: Payer: Medicare (Managed Care) | Admitting: Cardiovascular Disease

## 2021-01-08 ENCOUNTER — Other Ambulatory Visit: Payer: Self-pay

## 2021-01-08 VITALS — BP 120/60 | HR 60 | Ht 72.0 in | Wt 183.1 lb

## 2021-01-08 DIAGNOSIS — I10 Essential (primary) hypertension: Secondary | ICD-10-CM

## 2021-01-08 DIAGNOSIS — I4891 Unspecified atrial fibrillation: Secondary | ICD-10-CM | POA: Diagnosis not present

## 2021-01-08 DIAGNOSIS — R55 Syncope and collapse: Secondary | ICD-10-CM | POA: Diagnosis not present

## 2021-01-08 DIAGNOSIS — E782 Mixed hyperlipidemia: Secondary | ICD-10-CM

## 2021-01-08 DIAGNOSIS — I25118 Atherosclerotic heart disease of native coronary artery with other forms of angina pectoris: Secondary | ICD-10-CM

## 2021-01-08 MED ORDER — APIXABAN 5 MG PO TABS: 5.0000 mg | ORAL_TABLET | Freq: Two times a day (BID) | ORAL | 6 refills | Status: DC

## 2021-01-08 NOTE — Progress Notes (Signed)
Cardiology Office Note  Date:  01/08/2021   ID:  [name] [name], DOB 18-Jul-1932, MRN [id number]  PCP:  Nicholaus Corolla, MD   Chief Complaint  Patient presents with  . Follow-up    Discuss Echo, Carotid ultrasound and Zio monitor results. Patient c/o dizziness. Medications reviewed by the patient verbally.     HPI:  [name] [name] is a pleasant 87 year old gentleman with history of  coronary artery disease,  bypass surgery in 2013 with 3 vessel bypass,  diabetes with most recent hemoglobin A1c 6.9,  hypertension,  He has a history of sleep apnea but does not use CPAP He had a LIMA to the LAD, vein graft to the distal LAD, PDA and OM who presents for routine follow-up of his coronary artery disease   New onset atrial fibrillation noted April 2022, seen in our clinic at that time Low-dose metoprolol held for bradycardia and dizziness and syncope episodes ZIO monitor placed  ZIO reviewed with him in detail showing persistent atrial fibrillation, Short runs of narrow complex tachycardia, no patient triggered events associated with significant arrhythmia apart from atrial fibrillation  Reports no syncope or near syncope but does occasionally have dizzy episodes 1 time seem to be associated with a loose bowel movement Other time was dizzy when laying in bed He does report having vision issue 1 eye seem to, and then go  He is not on Eliquis, only on aspirin  Reports his diarrhea episodes have improved still little bit loose but much better  EKG personally reviewed by myself on todays visit Shows atrial fibrillation ventricular rate 60 bpm unable to exclude old anterior MI  Prior history med noncompliance, did not want a statin, Taking himself off Little bit of medication confusion when looking at his medication list   Other past medical history reviewed Cardiac catheterization performed 02/09/2012. This showed 40% ostial LAD disease, 60% followed by 90% proximal LAD disease, 90% distal  LAD disease, 60 followed by 70% ostial and proximal left circumflex disease, 30% OM 2 disease, 80% proximal RCA disease  Lab work  March 2015 shows hemoglobin A1c 8.3, creatinine 1.57  father died of cancer at age 3, mother died of stroke 60  PMH:   has a past medical history of CAD (coronary artery disease), Diabetes mellitus (HCC), Gout, HTN (hypertension), Hyperlipidemia, S/P CABG x 4 (03/11/2012), Shortness of breath, and Sleep apnea.  PSH:    Past Surgical History:  Procedure Laterality Date  . APPENDECTOMY    . BLADDER SURGERY    . CARDIAC CATHETERIZATION  2013   [facility]  . CATARACT EXTRACTION W/PHACO Left 07/07/2017   Procedure: CATARACT EXTRACTION PHACO AND INTRAOCULAR LENS PLACEMENT (IOC) LEFT DIABETIC;  Surgeon: Lockie Mola, MD;  Location: [facility];  Service: Ophthalmology;  Laterality: Left;  diabetic - oral meds  . COLONOSCOPY    . CORONARY ARTERY BYPASS GRAFT  03/11/2012   Procedure: CORONARY ARTERY BYPASS GRAFTING (CABG);  Surgeon: Purcell Nails, MD;  Location: [facility] OR;  Service: Open Heart Surgery;  Laterality: N/A;    Current Outpatient Medications  Medication Sig Dispense Refill  . amLODipine (NORVASC) 5 MG tablet Take 5 mg by mouth daily.    . Ascorbic Acid (VITAMIN C) 500 MG CHEW Chew by mouth daily.    Marland Kitchen aspirin EC 81 MG tablet Take 81 mg by mouth daily. Swallow whole.    . Cholecalciferol (VITAMIN D3) 25 MCG (1000 UT) CAPS Take by mouth daily.    . Doxylamine Succinate, Sleep, (  SLEEP AID PO) Take 25 mg by mouth at bedtime.    Marland Kitchen glipiZIDE (GLUCOTROL XL) 10 MG 24 hr tablet Take 10 mg by mouth 2 (two) times daily.     Marland Kitchen losartan (COZAAR) 100 MG tablet Take 100 mg by mouth daily.     . Melatonin 10 MG TABS Take 1-2 tablets by mouth at bedtime.    . Multiple Vitamins-Minerals (PRESERVISION AREDS 2 PO) Take 1 tablet by mouth every other day.    Melene Muller ON 01/19/2021] potassium chloride SA (KLOR-CON M20) 20 MEQ tablet Take 1 tablet (20 mEq total) by  mouth daily. 30 tablet 1  . zinc gluconate 50 MG tablet Take 50 mg by mouth every other day.     No current facility-administered medications for this visit.     Allergies:   Patient has no known allergies.   Social History:  The patient  reports that he has never smoked. He has never used smokeless tobacco. He reports current alcohol use. He reports that he does not use drugs.   Family History:   family history includes Cancer in his father; Stroke in his mother.    Review of Systems: Review of Systems  Constitutional: Negative.   HENT: Negative.   Respiratory: Negative.   Cardiovascular: Negative.   Gastrointestinal: Negative.   Musculoskeletal: Negative.   Neurological: Positive for dizziness.  Psychiatric/Behavioral: Negative.   All other systems reviewed and are negative.    PHYSICAL EXAM: VS:  BP 120/60 (BP Location: Left Arm, Patient Position: Sitting, Cuff Size: Normal)   Pulse 60   Ht 6' (1.829 m)   Wt 183 lb 2 oz (83.1 kg)   SpO2 98%   BMI 24.84 kg/m  , BMI Body mass index is 24.84 kg/m. Constitutional:  oriented to person, place, and time. No distress.  HENT:  Head: Grossly normal Eyes:  no discharge. No scleral icterus.  Neck: No JVD, no carotid bruits  Cardiovascular: Irregularly irregular no murmurs appreciated Pulmonary/Chest: Clear to auscultation bilaterally, no wheezes or rails Abdominal: Soft.  no distension.  no tenderness.  Musculoskeletal: Normal range of motion Neurological:  normal muscle tone. Coordination normal. No atrophy Skin: Skin warm and dry Psychiatric: normal affect, pleasant   Recent Labs: 12/12/2020: ALT 24; Hemoglobin 13.2; Platelets 223; TSH 4.780 12/31/2020: BUN 23; Creatinine, Ser 1.42; Potassium 4.5; Sodium 138    Lipid Panel No results found for: CHOL, HDL, LDLCALC, TRIG    Wt Readings from Last 3 Encounters:  01/08/21 183 lb 2 oz (83.1 kg)  12/12/20 186 lb 12.8 oz (84.7 kg)  11/28/20 175 lb (79.4 kg)      ASSESSMENT AND PLAN:  Atrial fibrillation Recent vision issue, unable to exclude TIA Will stop aspirin, start Eliquis 5 twice daily Avoid beta-blockers or rate control given prior bradycardia near syncope and syncope  currently asymptomatic it would seem from his atrial fibrillation  Dizziness Rare episodes, improved by holding metoprolol, no significant arrhythmia apart from atrial fibrillation on monitor Recommend he stand slowly, blood pressure is not low, stay hydrated Management of blood pressure medications as below  Essential hypertension - Plan: EKG 12-Lead Recommend he put amlodipine in the evening losartan in the morning  Mixed hyperlipidemia Took himself off Crestor, does not want to restart  Coronary artery disease involving native coronary artery of native heart without angina pectoris Currently with no symptoms of angina. No further workup at this time. Continue current medication regimen.  Type 2 diabetes mellitus with other circulatory complication,  without long-term current use of insulin (HCC) Sedentary, some gait instability  S/P CABG x 4 Denies anginal symptoms Declining statin No further work-up at this time  Diarrhea Symptoms improving, more formed May have been secondary to diabetes medication    Total encounter time more than 35 minutes  Greater than 50% was spent in counseling and coordination of care with the patient    No orders of the defined types were placed in this encounter.    Signed, Dossie Arbour, M.D., Ph.D. 01/08/2021  [facility] [place], [place] [phone]

## 2021-01-08 NOTE — Patient Instructions (Addendum)
Medication Instructions:  STOP  Aspirin   START Eliquis 5 mg twice a day  3 boxes of free samples  Lot :[id number]  Exp: 12/2022  Register 30-day free sample card  If need PA, printed out application online for Eliquis  Questions call  [phone]  Move the losartan to the morning Move the amlodipine to the evening  If you need a refill on your cardiac medications before your next appointment, please call your pharmacy.    Lab work: Have [employer] draw a CBC in one month, have them fax [place] the results to [facility] [phone]  Testing/Procedures: No new testing needed   Follow-Up:  . You will need a follow up appointment in 3 months  . Providers on your designated Care Team:   . Nicolasa Ducking, NP . Eula Listen, PA-C . Marisue Ivan, PA-C   COVID-19 Vaccine Information can be found at: [email or web address] For questions related to vaccine distribution or appointments, please email [email or web address] or call [phone].

## 2021-01-17 NOTE — Telephone Encounter (Signed)
Pt seen in office by Dr. Mariah Milling 01/08/21.

## 2021-04-14 NOTE — Progress Notes (Signed)
Cardiology Office Note  Date:  04/15/2021   ID:  Gorge, Almanza May 13, 1932, MRN [id number]  PCP:  Nicholaus Corolla, MD   Chief Complaint  Patient presents with   3 month follow up     "Doing well." Medications reviewed by the patient verbally.     HPI:  Mr. Wagley is a pleasant 87 year old gentleman with history of  coronary artery disease,  bypass surgery in 2013 with 3 vessel bypass,  He had a LIMA to the LAD, vein graft to the distal LAD, PDA and OM diabetes , well controlled hypertension,  history of sleep apnea but does not use CPAP who presents for routine follow-up of his coronary artery disease, atrial fibrillation  Doing well, reports no symptoms from his atrial fibrillation Tolerating anticoagulation, no bleeding, no falls No TIA sx Denies tachypalpitations Active, uses his chainsaw, planting pine trees, chopping down the lumbar, works on his land  No near syncope or syncope No lightheaded or dizzy spells  atrial fibrillation noted April 2022,  Low-dose metoprolol held for bradycardia and dizziness and syncope episodes  ZIO reviewed :persistent atrial fibrillation, Short runs of narrow complex tachycardia, no patient triggered events associated with significant arrhythmia apart from atrial fibrillation  EKG personally reviewed by myself on todays visit Atrial fibrillation ventricular rate 59 bpm left axis deviation  Other past medical history reviewed Prior episode dizziness/near syncope associated with loose bowel movement Other time was dizzy when laying in bed Prior vision issue 1 eye seem to, and then go  Prior history med noncompliance, did not want a statin, Taking himself off Little bit of medication confusion when looking at his medication list  Cardiac catheterization performed 02/09/2012. 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  PSH:    Past Surgical History:  Procedure Laterality Date   APPENDECTOMY     BLADDER SURGERY     CARDIAC CATHETERIZATION  2013   [facility]   CATARACT EXTRACTION W/PHACO Left 07/07/2017   Procedure: CATARACT EXTRACTION PHACO AND INTRAOCULAR LENS PLACEMENT (IOC) LEFT DIABETIC;  Surgeon: Lockie Mola, MD;  Location: [facility];  Service: Ophthalmology;  Laterality: Left;  diabetic - oral meds   COLONOSCOPY     CORONARY ARTERY BYPASS GRAFT  03/11/2012   Procedure: CORONARY ARTERY BYPASS GRAFTING (CABG);  Surgeon: Purcell Nails, MD;  Location: [facility] OR;  Service: Open Heart Surgery;  Laterality: N/A;    Current Outpatient Medications  Medication Sig Dispense Refill   amLODipine (NORVASC) 5 MG tablet Take 5 mg by mouth daily.     apixaban (ELIQUIS) 5 MG TABS tablet Take 1 tablet (5 mg total) by mouth 2 (two) times daily. 60 tablet 6   Ascorbic Acid (VITAMIN C) 500 MG CHEW Chew by mouth daily.     Cholecalciferol (VITAMIN D3) 25 MCG (1000 UT) CAPS Take by mouth daily.     Doxylamine Succinate, Sleep, (SLEEP AID PO) Take 25 mg by mouth at bedtime.     glipiZIDE (GLUCOTROL XL)  10 MG 24 hr tablet Take 10 mg by mouth 2 (two) times daily.      losartan (COZAAR) 100 MG tablet Take 100 mg by mouth daily.      Melatonin 10 MG TABS Take 1-2 tablets by mouth at bedtime.     Multiple Vitamins-Minerals (PRESERVISION AREDS 2 PO) Take 1 tablet by mouth every other day.     pentoxifylline (TRENTAL) 400 MG CR tablet Take 400 mg by mouth 2 (two) times daily.     zinc gluconate 50 MG tablet Take 50 mg by mouth every other day.      No current facility-administered medications for this visit.     Allergies:   Patient has no known allergies.   Social History:  The patient  reports that he has never smoked. He has never used smokeless tobacco. He reports current alcohol use. He reports that he does not use drugs.   Family History:   family history includes Cancer in his father; Stroke in his mother.    Review of Systems: Review of Systems  Constitutional: Negative.   HENT: Negative.    Respiratory: Negative.    Cardiovascular: Negative.   Gastrointestinal: Negative.   Musculoskeletal: Negative.   Neurological: Negative.   Psychiatric/Behavioral: Negative.    All other systems reviewed and are negative.   PHYSICAL EXAM: VS:  BP 130/60 (BP Location: Left Arm, Patient Position: Sitting, Cuff Size: Normal)   Pulse (!) 59   Ht 6' (1.829 m)   Wt 178 lb 4 oz (80.9 kg)   SpO2 98%   BMI 24.18 kg/m  , BMI Body mass index is 24.18 kg/m. Constitutional:  oriented to person, place, and time. No distress.  HENT:  Head: Grossly normal Eyes:  no discharge. No scleral icterus.  Neck: No JVD, no carotid bruits  Cardiovascular: Irregularly irregular no murmurs appreciated Pulmonary/Chest: Clear to auscultation bilaterally, no wheezes or rails Abdominal: Soft.  no distension.  no tenderness.  Musculoskeletal: Normal range of motion Neurological:  normal muscle tone. Coordination normal. No atrophy Skin: Skin warm and dry Psychiatric: normal affect, pleasant  Recent Labs: 12/12/2020: ALT 24; Hemoglobin 13.2; Platelets 223; TSH 4.780 12/31/2020: BUN 23; Creatinine, Ser 1.42; Potassium 4.5; Sodium 138    Lipid Panel No results found for: CHOL, HDL, LDLCALC, TRIG    Wt Readings from Last 3 Encounters:  04/15/21 178 lb 4 oz (80.9 kg)  01/08/21 183 lb 2 oz (83.1 kg)  12/12/20 186 lb 12.8 oz (84.7 kg)     ASSESSMENT AND PLAN:  Atrial fibrillation On Eliquis 5 twice daily, beta-blocker previously held for  bradycardia No changes made to medications Discussed treatment options, he is not particularly interested in cardioversion  Dizziness Previously noted rare episodes, none recently  Essential hypertension -  Blood pressure is well controlled on today's visit. No changes made to the medications.  Mixed hyperlipidemia Took himself off Crestor, does not want to restart  Coronary artery disease involving native coronary artery of native heart without angina pectoris Currently with no symptoms of angina. No further workup at this time. Continue current medication regimen.  Type 2 diabetes mellitus with other circulatory complication, without long-term current use of insulin (HCC) Sedentary, some gait instability  S/P CABG x 4 Denies anginal symptoms Declining statin No further work-up at this time  Diarrhea Waxing waning symptoms, not bothered by it    Total encounter time more than 25 minutes  Greater than 50% was spent in counseling and coordination of care  with the patient    No orders of the defined types were placed in this encounter.    Signed, Dossie Arbour, M.D., Ph.D. 04/15/2021  [facility] [place], [place] [phone]

## 2021-04-15 ENCOUNTER — Ambulatory Visit (INDEPENDENT_AMBULATORY_CARE_PROVIDER_SITE_OTHER): Payer: Medicare Other | Admitting: Cardiovascular Disease

## 2021-04-15 ENCOUNTER — Other Ambulatory Visit: Payer: Self-pay

## 2021-04-15 ENCOUNTER — Encounter: Payer: Self-pay | Admitting: Cardiovascular Disease

## 2021-04-15 VITALS — BP 130/60 | HR 59 | Ht 72.0 in | Wt 178.2 lb

## 2021-04-15 DIAGNOSIS — R55 Syncope and collapse: Secondary | ICD-10-CM | POA: Diagnosis not present

## 2021-04-15 DIAGNOSIS — I25118 Atherosclerotic heart disease of native coronary artery with other forms of angina pectoris: Secondary | ICD-10-CM | POA: Diagnosis not present

## 2021-04-15 DIAGNOSIS — I4891 Unspecified atrial fibrillation: Secondary | ICD-10-CM | POA: Diagnosis not present

## 2021-04-15 DIAGNOSIS — I10 Essential (primary) hypertension: Secondary | ICD-10-CM

## 2021-04-15 DIAGNOSIS — E782 Mixed hyperlipidemia: Secondary | ICD-10-CM

## 2021-04-15 NOTE — Patient Instructions (Addendum)
Medication Instructions:  No changes  If you need a refill on your cardiac medications before your next appointment, please call your pharmacy.   Lab work: No new labs needed  Testing/Procedures: No new testing needed  Follow-Up: At [facility], you and your health needs are our priority.  As part of our continuing mission to provide you with exceptional heart care, we have created designated Provider Care Teams.  These Care Teams include your primary Cardiologist (physician) and Advanced Practice Providers (APPs -  Physician Assistants and Nurse Practitioners) who all work together to provide you with the care you need, when you need it.  You will need a follow up appointment in 12 months  Providers on your designated Care Team:   Christopher Berge, NP Ryan Dunn, PA-C Jacquelyn Visser, PA-C Cadence Furth, PA-C  COVID-19 Vaccine Information can be found at: [email or web address] For questions related to vaccine distribution or appointments, please email [email or web address] or call [phone].    

## 2021-08-06 ENCOUNTER — Telehealth: Payer: Self-pay

## 2021-08-06 NOTE — Telephone Encounter (Signed)
Patient states he can no long afford Eliquis.   Patient states that he was paying about $40 for his eliquis.  Last month he got it and it was $70 and now its $100+  Patient states he only has about 2-3 days left of Eliquis.   He would like to know if there is an alternative.

## 2021-08-06 NOTE — Telephone Encounter (Signed)
Attempted to call pt, unable to make contact LMTCB  LDM on answering machining (DPR approved) Advised must try for Patient Assistance first before switching medication as he is probably in th donut hole as to why cost went up at end of year.   Advised will leave samples up front desk for him to come by office to pick up and the patient assistance application will be available as well for him to fill out.   Samples (4 boxes) Eliquis 5 mg Lot [id number] Exp 06/2023

## 2021-08-12 NOTE — Telephone Encounter (Signed)
Pt has picked up samples for Eliquis and PA application

## 2021-08-13 ENCOUNTER — Telehealth: Payer: Self-pay | Admitting: Cardiovascular Disease

## 2021-08-13 NOTE — Telephone Encounter (Signed)
Received PA application Completed provider's portion, attached copy of insurance card and med list  [facility] for out of pocket expense report, not record on file per pharmacist  [facility], they are faxing out-of pocket expense report at this time.   Once reviewed can fax application  PA Eliquis

## 2021-08-13 NOTE — Telephone Encounter (Signed)
Received fax from [facility] of pt's out-of-pocket expense report.  PA Application Eliquis Completed provider's portion, attached copy of insurance card and med list Dr. Mariah Milling has signed Form faxed and placed in file cabinet

## 2021-08-13 NOTE — Telephone Encounter (Signed)
Patient dropped off PAF, put in box 

## 2021-08-29 ENCOUNTER — Telehealth: Payer: Self-pay

## 2021-08-29 NOTE — Telephone Encounter (Signed)
Incoming fax from [facility], pt DENIED coverage for Eliquis    He has not met his 3% out-of-pocket expense in order to be cover.  He will need to get another print out from pharmacy when he has met the qualifications to send in toe [facility]  Application case # [id number]

## 2021-09-29 ENCOUNTER — Other Ambulatory Visit: Payer: Self-pay | Admitting: *Deleted

## 2021-09-29 MED ORDER — APIXABAN 5 MG PO TABS: 5.0000 mg | ORAL_TABLET | Freq: Two times a day (BID) | ORAL | 5 refills | Status: DC

## 2021-09-29 NOTE — Telephone Encounter (Signed)
Prescription refill request for Eliquis received.  Indication: afib  Last office visit: 04/15/2021, Gollan Scr: 1.42, 12/31/2020 Age: 88 yo  Weight: 80.9 kg   Refill sent.

## 2022-03-06 ENCOUNTER — Other Ambulatory Visit: Payer: Self-pay | Admitting: Cardiovascular Disease

## 2022-03-06 NOTE — Telephone Encounter (Signed)
Prescription refill request for Eliquis received. Indication:Afib Last office visit:8/22 BPZ:WCHEN labs Age: 88 Weight:80.9 kg  Prescription refilled

## 2022-03-06 NOTE — Telephone Encounter (Signed)
Refill request

## 2022-04-07 ENCOUNTER — Encounter: Payer: Self-pay | Admitting: *Deleted

## 2022-04-16 ENCOUNTER — Ambulatory Visit: Payer: Medicare Other | Attending: Medical | Admitting: Medical

## 2022-04-16 ENCOUNTER — Other Ambulatory Visit: Payer: Self-pay

## 2022-04-16 ENCOUNTER — Encounter: Payer: Self-pay | Admitting: Medical

## 2022-04-16 VITALS — BP 135/67 | HR 61 | Ht 72.0 in | Wt 185.0 lb

## 2022-04-16 DIAGNOSIS — I480 Paroxysmal atrial fibrillation: Secondary | ICD-10-CM | POA: Diagnosis not present

## 2022-04-16 DIAGNOSIS — I25118 Atherosclerotic heart disease of native coronary artery with other forms of angina pectoris: Secondary | ICD-10-CM | POA: Diagnosis not present

## 2022-04-16 DIAGNOSIS — I10 Essential (primary) hypertension: Secondary | ICD-10-CM | POA: Diagnosis not present

## 2022-04-16 DIAGNOSIS — R55 Syncope and collapse: Secondary | ICD-10-CM

## 2022-04-16 DIAGNOSIS — E782 Mixed hyperlipidemia: Secondary | ICD-10-CM

## 2022-04-16 MED ORDER — APIXABAN 5 MG PO TABS: 5.0000 mg | ORAL_TABLET | Freq: Two times a day (BID) | ORAL | 5 refills | Status: DC

## 2022-04-16 NOTE — Telephone Encounter (Signed)
Prescription refill request for Eliquis received. Indication: PAF Last office visit: 04/16/22  Peggyann Juba PA-C Scr: 1.45 on 02/27/22 Age: 88 Weight: 83.9kg  Based on above findings Eliquis 5mg  twice daily is the appropriate dose.  Refill approved.

## 2022-04-16 NOTE — Progress Notes (Signed)
Cardiology Office Note:    Date:  04/16/2022   ID:  [name] [name], DOB Jun 25, 1932, MRN [id number]  PCP:  [name] Corolla, MD  [facility] Cardiologist:  None  [facility] Electrophysiologist:  None   Referring MD: [name] Corolla, MD   Chief Complaint: 12 month follow-up  History of Present Illness:    [name] [name] is a 88 y.o. male with a hx of CAD s/p CABG x3 in 2013 (LIMA-LAD, vein graft to dLAD, PDA and OM, DM, HTN, OSA not on CPAP who presents for CAD and afib.  Previous cardiac bypass in 2013. He has not been maintained on statin due to patient preference. He has also declined CPAP. When last seen he was feeling well from a cardiac perspective, reported no anginal symptoms, and declined statin. His beta blocker, aspirin, and ARB were continued.   Seen for syncope 11/2020. Echo, carotid US and heart monitor was ordered.   Today, denies recurrent dizziness or lightheadedness. No further syncope. No chest pain, shortness of breath, LLE, orthopnea, pnd. He doesn't use a CPAP because he didn't tolerate it. PCP will check blood work.   Echo showed LVEF 55-60%, no WMA, moderately reduced RV function, moderately elevated pulmonary artery systolic pressure, severely dilated LA, mildly dilated RA, mild to mod MR, mild aortic sclerosis, mild ascending aortic dilation, measuring 36mm. US carotids did not show significant stenosis. Heart monitor showed 100% afib burden.   Past Medical History:  Diagnosis Date   CAD (coronary artery disease)    Diabetes mellitus (HCC)    Gout    HTN (hypertension)    Hyperlipidemia    S/P CABG x 4 03/11/2012   LIMA to mid LAD, SVG to distal LAD, SVG to OM, SVG to PDA, EVH via right thigh and leg   Shortness of breath    With ambulation   Sleep apnea    Does not wear machine    Past Surgical History:  Procedure Laterality Date   APPENDECTOMY     BLADDER SURGERY     CARDIAC CATHETERIZATION  2013   [facility]   CATARACT EXTRACTION W/PHACO Left  07/07/2017   Procedure: CATARACT EXTRACTION PHACO AND INTRAOCULAR LENS PLACEMENT (IOC) LEFT DIABETIC;  Surgeon: [name] Mola, MD;  Location: [facility];  Service: Ophthalmology;  Laterality: Left;  diabetic - oral meds   COLONOSCOPY     CORONARY ARTERY BYPASS GRAFT  03/11/2012   Procedure: CORONARY ARTERY BYPASS GRAFTING (CABG);  Surgeon: [name] Nails, MD;  Location: [facility] OR;  Service: Open Heart Surgery;  Laterality: N/A;    Current Medications: Current Meds  Medication Sig   amLODipine (NORVASC) 5 MG tablet Take 5 mg by mouth daily.   apixaban (ELIQUIS) 5 MG TABS tablet Take 1 tablet (5 mg total) by mouth 2 (two) times daily. NEEDS LABS FOR REFILLS, PLEASE CALL OFFICE   Ascorbic Acid (VITAMIN C) 500 MG CHEW Chew by mouth daily.   Cholecalciferol (VITAMIN D3) 25 MCG (1000 UT) CAPS Take by mouth daily.   Doxylamine Succinate, Sleep, (SLEEP AID PO) Take 25 mg by mouth at bedtime.   glipiZIDE (GLUCOTROL XL) 10 MG 24 hr tablet Take 10 mg by mouth 2 (two) times daily.    losartan (COZAAR) 100 MG tablet Take 100 mg by mouth daily.    Melatonin 10 MG TABS Take 1-2 tablets by mouth at bedtime.   Multiple Vitamins-Minerals (PRESERVISION AREDS 2 PO) Take 1 tablet by mouth every other day.   zinc gluconate 50  MG tablet Take 50 mg by mouth every other day.     Allergies:   Patient has no known allergies.   Social History   Socioeconomic History   Marital status: Single    Spouse name: Not on file   Number of children: Not on file   Years of education: Not on file   Highest education level: Not on file  Occupational History   Not on file  Tobacco Use   Smoking status: Never   Smokeless tobacco: Never  Vaping Use   Vaping Use: Never used  Substance and Sexual Activity   Alcohol use: Yes    Comment: rare   Drug use: No   Sexual activity: Not on file  Other Topics Concern   Not on file  Social History Narrative   Not on file   Social Determinants of Health    Financial Resource Strain: Not on file  Food Insecurity: Not on file  Transportation Needs: Not on file  Physical Activity: Not on file  Stress: Not on file  Social Connections: Not on file     Family History: The patient's family history includes Cancer in his father; Stroke in his mother.  ROS:   Please see the history of present illness.     All other systems reviewed and are negative.  EKGs/Labs/Other Studies Reviewed:    The following studies were reviewed today:  Echo 12/2020  1. Left ventricular ejection fraction, by estimation, is 55 to 60%. The  left ventricle has normal function. The left ventricle has no regional  wall motion abnormalities. Left ventricular diastolic parameters are  indeterminate. The average left  ventricular global longitudinal strain is -11.5 %. The global longitudinal  strain is abnormal.   2. Right ventricular systolic function is moderately reduced. The right  ventricular size is mildly enlarged. Mildly increased right ventricular  wall thickness. There is moderately elevated pulmonary artery systolic  pressure.   3. Left atrial size was severely dilated.   4. Right atrial size was mildly dilated.   5. The mitral valve is grossly normal. Mild to moderate mitral valve  regurgitation.   6. Tricuspid valve regurgitation is moderate.   7. The aortic valve is tricuspid. There is mild thickening of the aortic  valve. Aortic valve regurgitation is not visualized. Mild aortic valve  sclerosis is present, with no evidence of aortic valve stenosis.   8. Aortic dilatation noted. There is mild dilatation of the ascending  aorta, measuring 38 mm.   9. The inferior vena cava is normal in size with greater than 50%  respiratory variability, suggesting right atrial pressure of 3 mmHg.   Carotid US 12/2020  Summary:  Right Carotid: The extracranial vessels were near-normal with only minimal  wall                 thickening or plaque.   Left  Carotid: The extracranial vessels were near-normal with only minimal  wall                thickening or plaque.   Vertebrals:  Bilateral vertebral arteries demonstrate antegrade flow.  Bilateral               vertebral arteries demonstrate high resistant flow.  Subclavians: Normal flow hemodynamics were seen in bilateral subclavian               arteries.   Heart monitor 12/2020 Rhythm is atrial fibrillation, 100% burden, rate ranging 39 up to 153 bpm  average heart rate 66 bpm   14 Ventricular Tachycardia runs occurred, the run with the fastest interval lasting 4 beats with a max rate of 211 bpm, the longest lasting 5 beats with an avg rate of 115 bpm.     Isolated VEs were occasional (2.3%, 19011), VE Couplets were rare (<1.0%, 827), and VE Triplets were rare (<1.0%, 102).  Ventricular Bigeminy and Trigeminy were present.     Patient triggered events not associated with significant arrhythmia apart from atrial fibrillation   Signed, [name] Arbour, MD, Ph.D [facility]  EKG:  EKG is ordered today.  The ekg ordered today demonstrates Afib, 61bpm, LAD, PVC, low voltage  Recent Labs: No results found for requested labs within last 365 days.  Recent Lipid Panel No results found for: "CHOL", "TRIG", "HDL", "CHOLHDL", "VLDL", "LDLCALC", "LDLDIRECT"   Physical Exam:    VS:  BP 135/67 (BP Location: Left Arm, Patient Position: Sitting, Cuff Size: Normal)   Pulse 61   Ht 6' (1.829 m)   Wt 185 lb (83.9 kg)   SpO2 97%   BMI 25.09 kg/m     Wt Readings from Last 3 Encounters:  04/16/22 185 lb (83.9 kg)  04/15/21 178 lb 4 oz (80.9 kg)  01/08/21 183 lb 2 oz (83.1 kg)     GEN:  Well nourished, well developed in no acute distress HEENT: Normal NECK: No JVD; No carotid bruits LYMPHATICS: No lymphadenopathy CARDIAC: Irreg Irreg, no murmurs, rubs, gallops RESPIRATORY:  Clear to auscultation without rales, wheezing or rhonchi  ABDOMEN: Soft, non-tender, non-distended MUSCULOSKELETAL:   No edema; No deformity  SKIN: Warm and dry NEUROLOGIC:  Alert and oriented x 3 PSYCHIATRIC:  Normal affect   ASSESSMENT:    1. Coronary artery disease of native artery of native heart with stable angina pectoris (HCC)   2. Paroxysmal A-fib (HCC)   3. Syncope and collapse   4. Essential hypertension   5. Hyperlipidemia, mixed    PLAN:    In order of problems listed above:  Permanent Afib EKG shows rate controlled Afib with heart rate in the 60s. He is on Eliquis 5mg  Bid, he has some bruising, but no significant bleeding. PCP will check annual labs. Not on rate control medication due to baseline HR in the 60s.   CAD s/p CABG x 4 He denies anginal symptoms. No ASA with Eliquis. He took himself off Crestor. No BB with baseline bradycardia. No further work-up indicated.   Dizziness Syncope No further dizziness or syncope. BP and HR normal.  HTN BP good today. Continue amlodipine 5mg  daily and Losartan 100mg  daily.   HLD Needs updated lipid profile, he says PCP will order this.   Disposition: Follow up in 1 year(s) with MD/APP    Signed, Kasyn Rolph , PA-C  04/16/2022 3:11 PM    [facility]

## 2022-04-16 NOTE — Patient Instructions (Signed)
Medication Instructions:  Your physician recommends that you continue on your current medications as directed. Please refer to the Current Medication list given to you today.  *If you need a refill on your cardiac medications before your next appointment, please call your pharmacy*   Lab Work: None ordered If you have labs (blood work) drawn today and your tests are completely normal, you will receive your results only by: MyChart Message (if you have MyChart) OR A paper copy in the mail If you have any lab test that is abnormal or we need to change your treatment, we will call you to review the results.   Testing/Procedures: None ordered   Follow-Up: At [facility], you and your health needs are our priority.  As part of our continuing mission to provide you with exceptional heart care, we have created designated Provider Care Teams.  These Care Teams include your primary Cardiologist (physician) and Advanced Practice Providers (APPs -  Physician Assistants and Nurse Practitioners) who all work together to provide you with the care you need, when you need it.  We recommend signing up for the patient portal called "MyChart".  Sign up information is provided on this After Visit Summary.  MyChart is used to connect with patients for Virtual Visits (Telemedicine).  Patients are able to view lab/test results, encounter notes, upcoming appointments, etc.  Non-urgent messages can be sent to your provider as well.   To learn more about what you can do with MyChart, go to [email or web address].    Your next appointment:   Your physician wants you to follow-up in: 1 year You will receive a reminder letter in the mail two months in advance. If you don't receive a letter, please call our office to schedule the follow-up appointment.   The format for your next appointment:   In Person  Provider:   You may see Julien Nordmann, MD or one of the following Advanced Practice Providers on  your designated Care Team:   Nicolasa Ducking, NP Eula Listen, PA-C Cadence Fransico Michael, PA-C Charlsie Quest, NP   Other Instructions N/A  Important Information About Sugar

## 2022-10-05 ENCOUNTER — Other Ambulatory Visit: Payer: Self-pay | Admitting: Cardiovascular Disease

## 2022-10-05 DIAGNOSIS — I480 Paroxysmal atrial fibrillation: Secondary | ICD-10-CM

## 2022-10-05 NOTE — Telephone Encounter (Signed)
Please review

## 2022-10-05 NOTE — Telephone Encounter (Signed)
Eliquis 34m refill request received. Patient is 89years old, weight-83.9kg, Crea-1.17 on 06/02/22 via [facility], Diagnosis-Afib, and last seen by Cadence Furth on 04/16/22. Dose is appropriate based on dosing criteria. Will send in refill to requested pharmacy.

## 2023-04-12 NOTE — Progress Notes (Signed)
Cardiology Office Note  Date:  04/16/2023   ID:  [name], [name] 06/26/1932, MRN [id number]  PCP:  [name] Corolla, MD   Chief Complaint  Patient presents with   12 month follow up     Patient c/o shortness of breath when lying down at night. Medications reviewed by the patient verbally.     HPI:  [name] [name] is a pleasant 89 year old gentleman with history of  coronary artery disease,  bypass surgery in 2013 with 3 vessel bypass,  He had a LIMA to the LAD, vein graft to the distal LAD, PDA and OM diabetes , well controlled hypertension,  history of sleep apnea but does not use CPAP Echo May 2022 EF 55 to 60% moderately elevated right heart pressures, severely dilated left atrium who presents for routine follow-up of his coronary artery disease, permanent atrial fibrillation  Last seen by myself in clinic August 2022 Seen by one of our providers August 2023  Weight trending upwards, 20 pounds in 2 years Less active at baseline, having some knee pain, leg pain  Denies chest pain or shortness of breath on exertion, no symptoms from his atrial fibrillation  Lab work requested, reviewed, dated July 2024 Creatinine 1.57 BUN 29 A1c 7.4 total cholesterol 128 LDL 73 triglycerides 63  Recent life screening outside our facility ABIs  left 0.64, Right 0.85  Carotid: left mild Right : normal  EKG personally reviewed by myself on todays visit EKG Interpretation Date/Time:  Friday April 16 2023 09:05:57 EDT Ventricular Rate:  72 PR Interval:    QRS Duration:  82 QT Interval:  428 QTC Calculation: 468 R Axis:   -30  Text Interpretation: Atrial fibrillation with premature ventricular or aberrantly conducted complexes Left axis deviation Low voltage QRS Cannot rule out Anteroseptal infarct (cited on or before 16-Apr-2023) When compared with ECG of 26-Feb-2014 11:37, Atrial fibrillation has replaced Sinus rhythm Questionable change in initial forces of Septal leads Confirmed by  [name] [name] [phone]) on 04/16/2023 9:12:21 AM   Other past medical history reviewed atrial fibrillation noted April 2022,  Low-dose metoprolol held for bradycardia and dizziness and syncope episodes  ZIO reviewed :persistent atrial fibrillation, Short runs of narrow complex tachycardia, no patient triggered events associated with significant arrhythmia apart from atrial fibrillation  Prior episode dizziness/near syncope associated with loose bowel movement Other time was dizzy when laying in bed Prior vision issue 1 eye seem to, and then go  Prior history med noncompliance, did not want a statin, Taking himself off Little bit of medication confusion when looking at his medication list  Cardiac catheterization performed 02/09/2012. 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  PSH:    Past Surgical History:  Procedure Laterality Date   APPENDECTOMY     BLADDER SURGERY     CARDIAC CATHETERIZATION  2013   [facility]   CATARACT EXTRACTION W/PHACO Left 07/07/2017   Procedure: CATARACT EXTRACTION PHACO AND INTRAOCULAR LENS PLACEMENT (IOC) LEFT DIABETIC;  Surgeon: Lockie Mola, MD;  Location: [facility];  Service: Ophthalmology;  Laterality: Left;  diabetic -  oral meds   COLONOSCOPY     CORONARY ARTERY BYPASS GRAFT  03/11/2012   Procedure: CORONARY ARTERY BYPASS GRAFTING (CABG);  Surgeon: Purcell Nails, MD;  Location: [facility];  Service: Open Heart Surgery;  Laterality: N/A;    Current Outpatient Medications  Medication Sig Dispense Refill   amLODipine  (NORVASC) 5 MG tablet Take 5 mg by mouth daily.     apixaban (ELIQUIS) 5 MG TABS tablet Take 1 tablet (5 mg total) by mouth 2 (two) times daily. 60 tablet 5   Ascorbic Acid (VITAMIN C) 500 MG CHEW Chew by mouth daily.     Cholecalciferol (VITAMIN D3) 25 MCG (1000 UT) CAPS Take by mouth daily.     diphenhydrAMINE (BENADRYL) 25 MG tablet Take 25 mg by mouth at bedtime.     Doxylamine Succinate, Sleep, (SLEEP AID PO) Take 25 mg by mouth at bedtime.     glipiZIDE (GLUCOTROL XL) 10 MG 24 hr tablet Take 10 mg by mouth 2 (two) times daily.      JARDIANCE 25 MG TABS tablet Take 25 mg by mouth daily.     losartan (COZAAR) 100 MG tablet Take 100 mg by mouth daily.      Melatonin 10 MG TABS Take 1-2 tablets by mouth at bedtime.     metFORMIN (GLUCOPHAGE-XR) 750 MG 24 hr tablet Take 750 mg by mouth daily.     Multiple Vitamins-Minerals (PRESERVISION AREDS 2 PO) Take 1 tablet by mouth every other day.     pentoxifylline (TRENTAL) 400 MG CR tablet Take 400 mg by mouth 2 (two) times daily.     zinc gluconate 50 MG tablet Take 50 mg by mouth every other day.     No current facility-administered medications for this visit.    Allergies:   Patient has no known allergies.   Social History:  The patient  reports that he has never smoked. He has never used smokeless tobacco. He reports current alcohol use. He reports that he does not use drugs.   Family History:   family history includes Cancer in his father; Stroke in his mother.    Review of Systems: Review of Systems  Constitutional: Negative.   HENT: Negative.    Respiratory: Negative.    Cardiovascular: Negative.   Gastrointestinal: Negative.   Musculoskeletal: Negative.   Neurological: Negative.   Psychiatric/Behavioral: Negative.    All other systems reviewed and are negative.   PHYSICAL EXAM: VS:  BP 120/60 (BP Location: Left Arm, Patient Position: Sitting, Cuff Size: Normal)   Ht 6' (1.829 m)   Wt 197 lb (89.4 kg)   SpO2 95%   BMI  26.72 kg/m  , BMI Body mass index is 26.72 kg/m. Constitutional:  oriented to person, place, and time. No distress.  HENT:  Head: Grossly normal Eyes:  no discharge. No scleral icterus.  Neck: No JVD, no carotid bruits  Cardiovascular: Irregularly irregular,, no murmurs appreciated Pulmonary/Chest: Clear to auscultation bilaterally, no wheezes or rails Abdominal: Soft.  no distension.  no tenderness.  Musculoskeletal: Normal range of motion Neurological:  normal muscle tone. Coordination normal. No atrophy Skin: Skin warm and dry Psychiatric: normal affect, pleasant   Recent Labs: No results found for requested labs within last 365 days.    Lipid Panel No results found for: "CHOL", "HDL", "LDLCALC", "TRIG"    Wt Readings from Last 3 Encounters:  04/16/23 197 lb (89.4 kg)  04/16/22 185 lb (83.9 kg)  04/15/21 178 lb 4 oz (80.9 kg)  ASSESSMENT AND PLAN:  Atrial fibrillation On Eliquis 5 twice daily, beta-blocker previously held for bradycardia No changes made to medications Previously not interested in cardioversion, decision made for rate control Severely dilated left atrium indicating it would be difficult to maintain normal rhythm  Dizziness Previously noted rare episodes, none recently Blood pressure stable  Essential hypertension -  Blood pressure is well controlled on today's visit. No changes made to the medications.  Mixed hyperlipidemia Took himself off Crestor, does not want to restart  Coronary artery disease involving native coronary artery of native heart without angina pectoris Currently with no symptoms of angina. No further workup at this time. Continue current medication regimen.  Type 2 diabetes mellitus with other circulatory complication, without long-term current use of insulin (HCC) Sedentary, some gait instability, knee problems Weight trending higher, A1c in the 7 range  S/P CABG x 4 Denies anginal symptoms Declining statin, previously  took himself off Crestor No further work-up at this time  Diarrhea Waxing waning symptoms, not bothered by it  Claudication Reports having some pain in the legs Recent ABIs at lifestyle screening showing 0.65 on the left We will repeat ABIs with lower extremity arterial Dopplers    Total encounter time more than 30 minutes  Greater than 50% was spent in counseling and coordination of care with the patient    Orders Placed This Encounter  Procedures   EKG 12-Lead      Signed, Dossie Arbour, M.D., Ph.D. 04/16/2023  [facility] [place], [place] [phone]

## 2023-04-16 ENCOUNTER — Encounter: Payer: Self-pay | Admitting: Cardiovascular Disease

## 2023-04-16 ENCOUNTER — Ambulatory Visit: Payer: Medicare Other | Attending: Cardiovascular Disease | Admitting: Cardiovascular Disease

## 2023-04-16 VITALS — BP 120/60 | Ht 72.0 in | Wt 197.0 lb

## 2023-04-16 DIAGNOSIS — E1159 Type 2 diabetes mellitus with other circulatory complications: Secondary | ICD-10-CM

## 2023-04-16 DIAGNOSIS — I25118 Atherosclerotic heart disease of native coronary artery with other forms of angina pectoris: Secondary | ICD-10-CM | POA: Diagnosis not present

## 2023-04-16 DIAGNOSIS — I10 Essential (primary) hypertension: Secondary | ICD-10-CM | POA: Diagnosis not present

## 2023-04-16 DIAGNOSIS — Z7984 Long term (current) use of oral hypoglycemic drugs: Secondary | ICD-10-CM

## 2023-04-16 DIAGNOSIS — I739 Peripheral vascular disease, unspecified: Secondary | ICD-10-CM

## 2023-04-16 DIAGNOSIS — I480 Paroxysmal atrial fibrillation: Secondary | ICD-10-CM

## 2023-04-16 DIAGNOSIS — R55 Syncope and collapse: Secondary | ICD-10-CM

## 2023-04-16 DIAGNOSIS — E782 Mixed hyperlipidemia: Secondary | ICD-10-CM

## 2023-04-16 NOTE — Patient Instructions (Addendum)
Medication Instructions:  No changes  If you need a refill on your cardiac medications before your next appointment, please call your pharmacy.   Lab work: No new labs needed  Testing/Procedures: Your physician has requested that you have an ankle brachial index (ABI). During this test an ultrasound and blood pressure cuff are used to evaluate the arteries that supply the arms and legs with blood.  Allow thirty minutes for this exam.  There are no restrictions or special instructions.  This will take place at [address] ([facility] [address], [place] [address]   Your physician has requested that you have a lower extremity arterial duplex. During this test, ultrasound is used to evaluate arterial blood flow in the legs. Allow one hour for this exam. There are no restrictions or special instructions. This will take place at [address] ([facility] [address], [place] [address]   Follow-Up: At [facility], you and your health needs are our priority.  As part of our continuing mission to provide you with exceptional heart care, we have created designated Provider Care Teams.  These Care Teams include your primary Cardiologist (physician) and Advanced Practice Providers (APPs -  Physician Assistants and Nurse Practitioners) who all work together to provide you with the care you need, when you need it.  You will need a follow up appointment in 12 months  Providers on your designated Care Team:   Nicolasa Ducking, NP Eula Listen, PA-C Cadence Fransico Michael, [place]  COVID-19 Vaccine Information can be found at: [email or web address] For questions related to vaccine distribution or appointments, please email [email or web address] or call 8738463960.

## 2023-05-07 ENCOUNTER — Other Ambulatory Visit: Payer: Self-pay | Admitting: Cardiovascular Disease

## 2023-05-07 DIAGNOSIS — I480 Paroxysmal atrial fibrillation: Secondary | ICD-10-CM

## 2023-05-07 NOTE — Telephone Encounter (Signed)
Attempted to call pt.  No answer, voicemail box full.

## 2023-05-07 NOTE — Telephone Encounter (Signed)
Prescription refill request for Eliquis received. Indication: afib  Last office visit: Gollan, 04/16/2023 Scr: 1.57, 03/03/2023 Age: 89 yo  Weight: 89.4 kg   Per dosing criteria pt qualifies for a dose change.

## 2023-05-07 NOTE — Telephone Encounter (Signed)
Please review

## 2023-05-07 NOTE — Telephone Encounter (Signed)
Attempted to call pt. No answer and mailbox full

## 2023-05-07 NOTE — Telephone Encounter (Signed)
Trended labs in [facility] tab - SCr 1.57 on 03/03/23 however 2 prior SCr were both < 1.5: 1.17 on 06/02/22, 1.45 on 02/27/22.  Would recommend rechecking BMET to trend labs - if SCr remains > 1.5, agree with dose decrease.

## 2023-05-10 NOTE — Telephone Encounter (Signed)
Called and spoke with pt. Made him aware of the need for updated labs since Scr was slightly >1.5 on 03/03/23. Pt states he is scheduled for an ultrasound next Tuesday, 05/18/23 and can have labs drawn while he is out; however, pt states he is almost out of Eliquis and does not have enough to last until next week. Labs ordered and 30 day supply sent to requested pharmacy to prevent any missed doses. Pt understands he needs these updates labs for future refills for confirm he is on the appropriate dose.

## 2023-05-18 ENCOUNTER — Ambulatory Visit: Payer: Medicare Other | Attending: Cardiovascular Disease

## 2023-05-18 ENCOUNTER — Ambulatory Visit (INDEPENDENT_AMBULATORY_CARE_PROVIDER_SITE_OTHER): Payer: Medicare Other

## 2023-05-18 DIAGNOSIS — I739 Peripheral vascular disease, unspecified: Secondary | ICD-10-CM

## 2023-05-19 NOTE — Telephone Encounter (Signed)
Called pt to confirm he went to lab yesterday for updated BMET/CBC for future Eliquis refill. Pt states he was not able to go. Pt states he will go this week or next.

## 2023-05-20 ENCOUNTER — Other Ambulatory Visit
Admission: RE | Admit: 2023-05-20 | Discharge: 2023-05-20 | Disposition: A | Discharge status: Home / Self Care | Payer: Medicare Other | Attending: Cardiovascular Disease | Admitting: Cardiovascular Disease

## 2023-05-20 DIAGNOSIS — I480 Paroxysmal atrial fibrillation: Secondary | ICD-10-CM | POA: Insufficient documentation

## 2023-05-20 LAB — CBC
HCT: 41 % (ref 39.0–52.0)
Hemoglobin: 13 g/dL (ref 13.0–17.0)
MCH: 31 pg (ref 26.0–34.0)
MCHC: 31.7 g/dL (ref 30.0–36.0)
MCV: 97.9 fL (ref 80.0–100.0)
Platelets: 200 10*3/uL (ref 150–400)
RBC: 4.19 MIL/uL — ABNORMAL LOW (ref 4.22–5.81)
RDW: 15.4 % (ref 11.5–15.5)
WBC: 8.8 10*3/uL (ref 4.0–10.5)
nRBC: 0 % (ref 0.0–0.2)

## 2023-05-20 LAB — BASIC METABOLIC PANEL
Anion gap: 9 (ref 5–15)
BUN: 23 mg/dL (ref 8–23)
CO2: 23 mmol/L (ref 22–32)
Calcium: 8.6 mg/dL — ABNORMAL LOW (ref 8.9–10.3)
Chloride: 109 mmol/L (ref 98–111)
Creatinine, Ser: 1.28 mg/dL — ABNORMAL HIGH (ref 0.61–1.24)
GFR, Estimated: 53 mL/min — ABNORMAL LOW (ref >60)
Glucose, Bld: 164 mg/dL — ABNORMAL HIGH (ref 70–99)
Potassium: 3.7 mmol/L (ref 3.5–5.1)
Sodium: 141 mmol/L (ref 135–145)

## 2023-05-21 LAB — VAS US ABI WITH/WO TBI
Left ABI: 0.6
Right ABI: 0.93

## 2023-06-01 ENCOUNTER — Telehealth: Payer: Self-pay | Admitting: Emergency Medicine

## 2023-06-01 DIAGNOSIS — I739 Peripheral vascular disease, unspecified: Secondary | ICD-10-CM

## 2023-06-01 NOTE — Telephone Encounter (Signed)
-----   Message from Julien Nordmann sent at 05/31/2023  1:40 PM EDT ----- Lower extremity arterial Doppler There appears to be evidence of blockage/stenosis left lower extremity Further evaluation can be done with lower extremity arterial CTA Can we ask him if he would like aggressive evaluation or medical management can be continued without CT scans On my last discussion in clinic, he did not seem to have claudication symptoms

## 2023-06-01 NOTE — Telephone Encounter (Signed)
Called patient and notified him of the following from Dr. Mariah Milling.  Lower extremity arterial Doppler  There appears to be evidence of blockage/stenosis left lower extremity  Further evaluation can be done with lower extremity arterial CTA  Can we ask him if he would like aggressive evaluation or medical management can be continued without CT scans  On my last discussion in clinic, he did not seem to have claudication symptoms   Patient verbalizes understanding. Patient states that he would like to have the CT scan. Orders placed for CT.

## 2023-06-07 ENCOUNTER — Other Ambulatory Visit: Payer: Self-pay | Admitting: Cardiovascular Disease

## 2023-06-07 DIAGNOSIS — I480 Paroxysmal atrial fibrillation: Secondary | ICD-10-CM

## 2023-06-07 NOTE — Telephone Encounter (Signed)
Refill request

## 2023-06-07 NOTE — Telephone Encounter (Signed)
Pt last saw Dr Mariah Milling 04/16/23, last labs 05/20/23 Creat 1.28, age 89, weight 89.4kg, based on specified criteria pt is on appropriate dosage of Eliquis 5mg  BID for afib.  Will refill rx.

## 2023-06-15 ENCOUNTER — Telehealth: Payer: Self-pay | Admitting: Emergency Medicine

## 2023-06-15 DIAGNOSIS — I739 Peripheral vascular disease, unspecified: Secondary | ICD-10-CM

## 2023-06-15 NOTE — Telephone Encounter (Signed)
Lower extremity arterial Doppler There appears to be evidence of blockage/stenosis left lower extremity Further evaluation can be done with lower extremity arterial CTA Can we ask him if he would like aggressive evaluation or medical management can be continued without CT scans On my last discussion in clinic, he did not seem to have claudication symptoms  Per Dr. Mariah Milling.

## 2023-06-18 ENCOUNTER — Ambulatory Visit
Admission: RE | Admit: 2023-06-18 | Discharge: 2023-06-18 | Disposition: A | Discharge status: Home / Self Care | Payer: Medicare Other | Source: Ambulatory Visit | Attending: Cardiovascular Disease | Admitting: Cardiovascular Disease

## 2023-06-18 DIAGNOSIS — I739 Peripheral vascular disease, unspecified: Secondary | ICD-10-CM | POA: Insufficient documentation

## 2023-06-18 MED ORDER — IOHEXOL 350 MG/ML SOLN
125.0000 mL | Freq: Once | INTRAVENOUS | Status: AC | PRN
  Administered 2023-06-18: 125 mL via INTRAVENOUS

## 2023-07-05 ENCOUNTER — Telehealth: Payer: Self-pay | Admitting: Cardiovascular Disease

## 2023-07-05 ENCOUNTER — Other Ambulatory Visit: Payer: Self-pay | Admitting: Emergency Medicine

## 2023-07-05 NOTE — Telephone Encounter (Signed)
-----   Message from Ermalene Searing sent at 07/05/2023  3:05 PM EST ----- Regarding: Kirke Corin  ----- Message ----- From: Jani Gravel, RN Sent: 07/05/2023   1:11 PM EST To: [employer]  I would try to get him in within a few weeks if we can. ----- Message ----- From: [employer], [employer] Sent: 07/05/2023  12:45 PM EST To: Jani Gravel, RN  Hey, how urgent is this appointment needed? I am going to check with Martie Lee to find availability since Kirke Corin is booked pretty far out. ----- Message ----- From: Jani Gravel, RN Sent: 07/05/2023  10:51 AM EST To: [employer]  Can you schedule this patient with Dr. Kirke Corin for vascular please?  Lower extremity arterial CT results reviewed  Significant stenosis noted in the arterial flow to legs on CT scan  Would recommend appointment with Dr. Kirke Corin to determine if intervention needed    Thank you,  Elon Jester, RN

## 2023-07-05 NOTE — Telephone Encounter (Signed)
Left message with Darel Hong , sister, to inform patient of appointment scheduled with Dr. Kirke Corin 11/26

## 2023-07-13 ENCOUNTER — Encounter: Payer: Self-pay | Admitting: Cardiovascular Disease

## 2023-07-13 ENCOUNTER — Ambulatory Visit: Payer: Medicare Other | Attending: Cardiovascular Disease | Admitting: Cardiovascular Disease

## 2023-07-13 VITALS — BP 134/80 | HR 92 | Ht 72.0 in | Wt 190.1 lb

## 2023-07-13 DIAGNOSIS — I480 Paroxysmal atrial fibrillation: Secondary | ICD-10-CM

## 2023-07-13 DIAGNOSIS — I25118 Atherosclerotic heart disease of native coronary artery with other forms of angina pectoris: Secondary | ICD-10-CM | POA: Diagnosis not present

## 2023-07-13 DIAGNOSIS — I10 Essential (primary) hypertension: Secondary | ICD-10-CM

## 2023-07-13 DIAGNOSIS — I739 Peripheral vascular disease, unspecified: Secondary | ICD-10-CM | POA: Diagnosis not present

## 2023-07-13 DIAGNOSIS — E785 Hyperlipidemia, unspecified: Secondary | ICD-10-CM

## 2023-07-13 NOTE — Progress Notes (Signed)
Cardiology Office Note   Date:  07/22/2023   ID:  [name] [name], DOB 06-Feb-1932, MRN [id number]  PCP:  Nicholaus Corolla, MD  Cardiologist: Dr. Mariah Milling  Chief Complaint  Patient presents with   Consult    Significant stenosis/arterial flow to legs on CT Scan. Meds reviewed verbally with pt.      History of Present Illness: [name] [name] is a 89 y.o. male who was referred by Dr. Mariah Milling for evaluation management of peripheral arterial disease. He has known history of coronary artery disease status post CABG in 2013, atrial fibrillation on anticoagulation, type 2 diabetes, essential hypertension, obstructive sleep apnea and hyperlipidemia. He had recent life screening which showed an ABI of 0.64 on the left and 0.85 on the right.  Carotid Doppler showed mild disease on the left. He did have repeat Doppler studies in our office which confirmed an ABI of 0.6 on the left and 0.93 on the right.  Duplex showed possible distal SFA disease on the left.  CT angiogram showed severe stenosis in the proximal left popliteal artery with significant runoff disease bilaterally.  He seems to be limited by left knee pain and arthritis more than calf discomfort itself.  No lower extremity ulceration.  He reports exertional dyspnea but no chest pain.    Past Medical History:  Diagnosis Date   CAD (coronary artery disease)    Diabetes mellitus (HCC)    Gout    HTN (hypertension)    Hyperlipidemia    S/P CABG x 4 03/11/2012   LIMA to mid LAD, SVG to distal LAD, SVG to OM, SVG to PDA, EVH via right thigh and leg   Shortness of breath    With ambulation   Sleep apnea    Does not wear machine    Past Surgical History:  Procedure Laterality Date   APPENDECTOMY     BLADDER SURGERY     CARDIAC CATHETERIZATION  2013   [facility]   CATARACT EXTRACTION W/PHACO Left 07/07/2017   Procedure: CATARACT EXTRACTION PHACO AND INTRAOCULAR LENS PLACEMENT (IOC) LEFT DIABETIC;  Surgeon: Lockie Mola, MD;   Location: [facility];  Service: Ophthalmology;  Laterality: Left;  diabetic - oral meds   COLONOSCOPY     CORONARY ARTERY BYPASS GRAFT  03/11/2012   Procedure: CORONARY ARTERY BYPASS GRAFTING (CABG);  Surgeon: Purcell Nails, MD;  Location: [facility];  Service: Open Heart Surgery;  Laterality: N/A;     Current Outpatient Medications  Medication Sig Dispense Refill   amLODipine (NORVASC) 5 MG tablet Take 5 mg by mouth daily.     apixaban (ELIQUIS) 5 MG TABS tablet Take 1 tablet (5 mg total) by mouth 2 (two) times daily. 60 tablet 5   Ascorbic Acid (VITAMIN C) 500 MG CHEW Chew by mouth daily.     Cholecalciferol (VITAMIN D3) 25 MCG (1000 UT) CAPS Take by mouth daily.     diphenhydrAMINE (BENADRYL) 25 MG tablet Take 25 mg by mouth at bedtime.     Doxylamine Succinate, Sleep, (SLEEP AID PO) Take 25 mg by mouth at bedtime.     glipiZIDE (GLUCOTROL XL) 10 MG 24 hr tablet Take 10 mg by mouth 2 (two) times daily.      JARDIANCE 25 MG TABS tablet Take 25 mg by mouth daily.     losartan (COZAAR) 100 MG tablet Take 100 mg by mouth daily.      Melatonin 10 MG TABS Take 1-2 tablets by mouth at bedtime.  metFORMIN (GLUCOPHAGE-XR) 750 MG 24 hr tablet Take 750 mg by mouth daily.     Multiple Vitamins-Minerals (PRESERVISION AREDS 2 PO) Take 1 tablet by mouth every other day.     pentoxifylline (TRENTAL) 400 MG CR tablet Take 400 mg by mouth 2 (two) times daily.     zinc gluconate 50 MG tablet Take 50 mg by mouth every other day.     No current facility-administered medications for this visit.    Allergies:   Patient has no known allergies.    Social History:  The patient  reports that he has never smoked. He has never used smokeless tobacco. He reports current alcohol use. He reports that he does not use drugs.   Family History:  The patient's family history includes Cancer in his father; Stroke in his mother.    ROS:  Please see the history of present illness.   Otherwise, review of  systems are positive for none.   All other systems are reviewed and negative.    PHYSICAL EXAM: VS:  BP 134/80 (BP Location: Left Arm, Patient Position: Sitting, Cuff Size: Normal)   Pulse 92   Ht 6' (1.829 m)   Wt 190 lb 2 oz (86.2 kg)   SpO2 95%   BMI 25.79 kg/m  , BMI Body mass index is 25.79 kg/m. GEN: Well nourished, well developed, in no acute distress  HEENT: normal  Neck: no JVD, carotid bruits, or masses Cardiac: RRR; no murmurs, rubs, or gallops,no edema  Respiratory:  clear to auscultation bilaterally, normal work of breathing GI: soft, nontender, nondistended, + BS MS: no deformity or atrophy  Skin: warm and dry, no rash Neuro:  Strength and sensation are intact Psych: euthymic mood, full affect   EKG:  EKG is not ordered today.    Recent Labs: 05/20/2023: BUN 23; Creatinine, Ser 1.28; Hemoglobin 13.0; Platelets 200; Potassium 3.7; Sodium 141    Lipid Panel No results found for: "CHOL", "TRIG", "HDL", "CHOLHDL", "VLDL", "LDLCALC", "LDLDIRECT"    Wt Readings from Last 3 Encounters:  07/13/23 190 lb 2 oz (86.2 kg)  04/16/23 197 lb (89.4 kg)  04/16/22 185 lb (83.9 kg)           No data to display            ASSESSMENT AND PLAN:  1.  Peripheral arterial disease: He has significant peripheral arterial disease especially on the left side.  However, he denies any claudication in his lower lower extremity ulceration.  He seems to be limited by arthritis more than peripheral arterial disease.  I recommend aggressive medical therapy and treatment of risk factors.  Angiography and revascularization will be reserved for worsening symptoms.  2.  Atrial fibrillation: On anticoagulation with Eliquis.  3.  Coronary artery disease involving native coronary arteries: No angina at this time  4.  Hyperlipidemia: Took himself off rosuvastatin.  I discussed the rationale for treatment with a statin but he is hesitant.  5.  Essential hypertension: Blood pressure is  controlled.    Disposition:   FU with me in 6 months  Signed,  Lorine Bears, MD  07/22/2023 11:21 AM    [facility]

## 2023-07-13 NOTE — Patient Instructions (Signed)
Medication Instructions:  No changes *If you need a refill on your cardiac medications before your next appointment, please call your pharmacy*   Lab Work: None ordered If you have labs (blood work) drawn today and your tests are completely normal, you will receive your results only by: MyChart Message (if you have MyChart) OR A paper copy in the mail If you have any lab test that is abnormal or we need to change your treatment, we will call you to review the results.   Testing/Procedures: None ordered   Follow-Up: At [facility], you and your health needs are our priority.  As part of our continuing mission to provide you with exceptional heart care, we have created designated Provider Care Teams.  These Care Teams include your primary Cardiologist (physician) and Advanced Practice Providers (APPs -  Physician Assistants and Nurse Practitioners) who all work together to provide you with the care you need, when you need it.  We recommend signing up for the patient portal called "MyChart".  Sign up information is provided on this After Visit Summary.  MyChart is used to connect with patients for Virtual Visits (Telemedicine).  Patients are able to view lab/test results, encounter notes, upcoming appointments, etc.  Non-urgent messages can be sent to your provider as well.   To learn more about what you can do with MyChart, go to [email or web address].    Your next appointment:   6 month(s)  Provider:   Dr. Arida  

## 2024-03-03 ENCOUNTER — Other Ambulatory Visit: Payer: Self-pay | Admitting: Cardiovascular Disease

## 2024-03-03 DIAGNOSIS — I480 Paroxysmal atrial fibrillation: Secondary | ICD-10-CM

## 2024-03-03 NOTE — Telephone Encounter (Signed)
 Eliquis  5mg  refill request received. Patient is 89 years old, weight-86.2kg, Crea-1.28 on 05/20/23, Diagnosis-Afib, and last seen by Dr. Darron on 07/13/23. Dose is appropriate based on dosing criteria. Will send in refill to requested pharmacy.

## 2024-07-26 ENCOUNTER — Other Ambulatory Visit: Payer: Self-pay

## 2024-07-26 ENCOUNTER — Emergency Department: Admission: EM | Admit: 2024-07-26 | Discharge: 2024-07-26 | Disposition: A | Discharge status: Home / Self Care

## 2024-07-26 ENCOUNTER — Encounter: Payer: Self-pay | Admitting: Emergency Medicine

## 2024-07-26 ENCOUNTER — Emergency Department

## 2024-07-26 DIAGNOSIS — I10 Essential (primary) hypertension: Secondary | ICD-10-CM | POA: Diagnosis not present

## 2024-07-26 DIAGNOSIS — Z7901 Long term (current) use of anticoagulants: Secondary | ICD-10-CM | POA: Diagnosis not present

## 2024-07-26 DIAGNOSIS — W182XXA Fall in (into) shower or empty bathtub, initial encounter: Secondary | ICD-10-CM | POA: Insufficient documentation

## 2024-07-26 DIAGNOSIS — Z951 Presence of aortocoronary bypass graft: Secondary | ICD-10-CM | POA: Diagnosis not present

## 2024-07-26 DIAGNOSIS — M542 Cervicalgia: Secondary | ICD-10-CM | POA: Insufficient documentation

## 2024-07-26 DIAGNOSIS — S0990XA Unspecified injury of head, initial encounter: Secondary | ICD-10-CM | POA: Diagnosis present

## 2024-07-26 DIAGNOSIS — S0093XA Contusion of unspecified part of head, initial encounter: Secondary | ICD-10-CM

## 2024-07-26 DIAGNOSIS — I2510 Atherosclerotic heart disease of native coronary artery without angina pectoris: Secondary | ICD-10-CM | POA: Insufficient documentation

## 2024-07-26 DIAGNOSIS — E119 Type 2 diabetes mellitus without complications: Secondary | ICD-10-CM | POA: Diagnosis not present

## 2024-07-26 DIAGNOSIS — S0003XA Contusion of scalp, initial encounter: Secondary | ICD-10-CM | POA: Diagnosis not present

## 2024-07-26 NOTE — ED Provider Notes (Signed)
 [facility] Provider Note    Event Date/Time   First MD Initiated Contact with Patient 07/26/24 1818     (approximate)   History   Fall   HPI  [name] [name] is a 89 y.o. male with CAD status post CABG in 2013, atrial fibrillation on anticoagulation, type 2 diabetes, hypertension, OSA and hyperlipidemia who presents to the emergency department with bruising to his occiput left face and neck.  Patient states that 1 week ago he lost his balance in the bathroom and fell into the bathtub.  He did not have any loss of consciousness or any headache and did not seek medical evaluation at that time.  He is compliant with his Eliquis  and when he went to pick up a new prescription/refill of his medication the pharmacist sent him to our emergency department given the bruising.  He denies any headache hearing or vision changes, worsening neck pain chest pain abdominal pain changes in urinary or bowel habits.  This is his only reason why he is here today.  He presents with his wife who contributes to the history      Physical Exam   Triage Vital Signs: ED Triage Vitals  Encounter Vitals Group     BP 07/26/24 1726 (!) 173/85     Girls Systolic BP Percentile --      Girls Diastolic BP Percentile --      Boys Systolic BP Percentile --      Boys Diastolic BP Percentile --      Pulse Rate 07/26/24 1726 87     Resp 07/26/24 1726 20     Temp 07/26/24 1725 99.1 F (37.3 C)     Temp Source 07/26/24 1725 Oral     SpO2 07/26/24 1726 95 %     Weight 07/26/24 1725 190 lb (86.2 kg)     Height 07/26/24 1725 6' (1.829 m)     Head Circumference --      Peak Flow --      Pain Score 07/26/24 1725 0     Pain Loc --      Pain Education --      Exclude from Growth Chart --     Most recent vital signs: Vitals:   07/26/24 1726 07/26/24 1830  BP: (!) 173/85   Pulse: 87 81  Resp: 20   Temp:    SpO2: 95% 94%    Nursing Triage Note reviewed. Vital signs reviewed and patients  oxygen saturation is normoxic  General: Patient is well nourished, well developed, awake and alert, resting comfortably in no acute distress Head: Normocephalic, patient does have a hematoma on the occiput of his head but no scalp lacerations or lesions Eyes: Normal inspection, extraocular muscles intact, no conjunctival pallor Ear, nose, throat: Patient does have some tenderness to palpation of his maxilla and there is bruising over his left mandible but no trismus Neck: Normal range of motion, patient has left paraspinal tenderness to palpation but no midline cervical tenderness to palpation Respiratory: Patient is in no respiratory distress,  Cardiovascular: Patient is not tachycardic,  GI: Abd SNT with no guarding or rebound  Back: Normal inspection of the back with good strength and range of motion throughout all ext Extremities: pulses intact with good cap refills, no LE pitting edema or calf tenderness Neuro: The patient is alert and oriented to person, place, and time, appropriately conversive, with 5/5 bilat UE/LE strength, no gross motor or sensory defects noted. Coordination appears  to be adequate.  Ambulates into the room without abnormality Skin: Warm, dry, and intact Psych: normal mood and affect, no SI or HI  ED Results / Procedures / Treatments   Labs (all labs ordered are listed, but only abnormal results are displayed) Labs Reviewed - No data to display   EKG None  RADIOLOGY CT head: No intracranial hemorrhage on my independent review interpretation radiologist agrees CT C-spine: No cervical fracture on my independent review interpretation and radiologist agrees CT max face: No facial fractures on my independent review interpretation and radiologist agrees    PROCEDURES:  Critical Care performed: No  Procedures   MEDICATIONS ORDERED IN ED: Medications - No data to display   IMPRESSION / MDM / ASSESSMENT AND PLAN / ED COURSE                                 Differential diagnosis includes, but is not limited to, intracranial hemorrhage, head contusion, cervical spine fracture, facial fracture, facial contusion  ED course: Patient is well-appearing and without any focal neurological deficits.  His face is covered on the left side with ecchymosis which appeared to be in a healing stage he does have a large occiput hematoma.  He is otherwise asymptomatic and only came at his pharmacist suggestion.  CT imaging of the head C-spine and maxillofacial was unremarkable.  Patient and wife felt reassured and felt comfortable returning home.  They will follow-up with their primary care physician   Clinical Course as of 07/26/24 2319  Wed Jul 26, 2024  1900 I reviewed the workup with the patient and his wife.  Both expressed understanding and relief.  All felt comfortable returning home.  They will continue their regular medications and return if any acutely worsening symptoms or any other emergency [HD]    Clinical Course User Index [HD] Nicholaus Rolland BRAVO, MD   At time of discharge there is no evidence of acute life, limb, vision, or fertility threat. Patient has stable vital signs, pain is well controlled, patient is ambulatory and p.o. tolerant.  Discharge instructions were completed using the EPIC system. I would refer you to those at this time. All warnings prescriptions follow-up etc. were discussed in detail with the patient. Patient indicates understanding and is agreeable with this plan. All questions answered.  Patient is made aware that they may return to the emergency department for any worsening or new condition or for any other emergency.  -- Risk: 5 This patient has a high risk of morbidity due to further diagnostic testing or treatment. Rationale: This patients evaluation and management involve a high risk of morbidity due to the potential severity of presenting symptoms, need for diagnostic testing, and/or initiation of treatment that may  require close monitoring. The differential includes conditions with potential for significant deterioration or requiring escalation of care. Treatment decisions in the ED, including medication administration, procedural interventions, or disposition planning, reflect this level of risk. COPA: 5 The patient has the following acute or chronic illness/injury that poses a possible threat to life or bodily function: [X] : The patient has a potentially serious acute condition or an acute exacerbation of a chronic illness requiring urgent evaluation and management in the Emergency Department. The clinical presentation necessitates immediate consideration of life-threatening or function-threatening diagnoses, even if they are ultimately ruled out.   FINAL CLINICAL IMPRESSION(S) / ED DIAGNOSES   Final diagnoses:  Contusion of head, unspecified part of  head, initial encounter     Rx / DC Orders   ED Discharge Orders     None        Note:  This document was prepared using Dragon voice recognition software and may include unintentional dictation errors.   Nicholaus Rolland BRAVO, MD 07/26/24 [phone]

## 2024-07-26 NOTE — Discharge Instructions (Signed)

## 2024-07-26 NOTE — ED Triage Notes (Signed)
 Patient arrives ambulatory by POV reports losing his balance a week ago causing him to fall into the bath tub. Patient has bruising to left side of face/ neck and knot on back of head. Patient on eliquis  and attempted to pick up prescription but pharmacist sent pt here after seeing bruising. Denies any pain or complaints.

## 2024-09-19 ENCOUNTER — Other Ambulatory Visit: Payer: Self-pay | Admitting: Cardiovascular Disease

## 2024-09-19 DIAGNOSIS — I480 Paroxysmal atrial fibrillation: Secondary | ICD-10-CM

## 2024-12-20 ENCOUNTER — Ambulatory Visit: Admitting: Cardiovascular Disease
